# Patient Record
Sex: Male | Born: 1960 | Race: Black or African American | Hispanic: No | Marital: Single | Smoking: Never smoker
Health system: Southern US, Community
[De-identification: ages and names within clinical notes are randomized; demographics above are authoritative.]

## PROBLEM LIST (undated history)

## (undated) DIAGNOSIS — I10 Essential (primary) hypertension: Secondary | ICD-10-CM

## (undated) DIAGNOSIS — G629 Polyneuropathy, unspecified: Secondary | ICD-10-CM

## (undated) DIAGNOSIS — E559 Vitamin D deficiency, unspecified: Secondary | ICD-10-CM

## (undated) HISTORY — PX: OTHER SURGICAL HISTORY: SHX169

## (undated) HISTORY — DX: Essential (primary) hypertension: I10

## (undated) HISTORY — PX: ANKLE FRACTURE SURGERY: SHX122

## (undated) HISTORY — PX: SHOULDER ARTHROSCOPY: SHX128

## (undated) HISTORY — DX: Vitamin D deficiency, unspecified: E55.9

## (undated) HISTORY — PX: APPENDECTOMY: SHX54

## (undated) HISTORY — DX: Polyneuropathy, unspecified: G62.9

---

## 2005-08-14 ENCOUNTER — Emergency Department (HOSPITAL_COMMUNITY): Admission: EM | Admit: 2005-08-14 | Discharge: 2005-08-15 | Payer: Self-pay | Admitting: Emergency Medicine

## 2006-01-09 ENCOUNTER — Emergency Department (HOSPITAL_COMMUNITY): Admission: EM | Admit: 2006-01-09 | Discharge: 2006-01-09 | Payer: Self-pay | Admitting: Emergency Medicine

## 2006-01-18 ENCOUNTER — Ambulatory Visit: Payer: Self-pay | Admitting: Cardiology

## 2007-10-31 ENCOUNTER — Emergency Department (HOSPITAL_COMMUNITY): Admission: EM | Admit: 2007-10-31 | Discharge: 2007-10-31 | Payer: Self-pay | Admitting: Emergency Medicine

## 2009-07-18 ENCOUNTER — Encounter: Admission: RE | Admit: 2009-07-18 | Discharge: 2009-07-18 | Payer: Self-pay | Admitting: Orthopedic Surgery

## 2011-04-30 LAB — CBC
MCV: 92
Platelets: 251
RDW: 12.5
WBC: 9.3

## 2011-04-30 LAB — DIFFERENTIAL
Basophils Absolute: 0
Basophils Relative: 0
Eosinophils Absolute: 0.1
Lymphocytes Relative: 25
Lymphs Abs: 2.3
Monocytes Absolute: 0.6
Monocytes Relative: 6

## 2011-04-30 LAB — POCT I-STAT, CHEM 8
BUN: 11
Chloride: 100
Creatinine, Ser: 1.5
Glucose, Bld: 83
HCT: 46
Hemoglobin: 15.6
TCO2: 30

## 2011-04-30 LAB — POCT CARDIAC MARKERS: Myoglobin, poc: 65

## 2015-01-05 ENCOUNTER — Other Ambulatory Visit: Payer: Self-pay | Admitting: Internal Medicine

## 2015-01-05 DIAGNOSIS — M542 Cervicalgia: Secondary | ICD-10-CM

## 2015-04-20 ENCOUNTER — Ambulatory Visit: Payer: Self-pay | Admitting: Cardiovascular Disease

## 2018-06-13 ENCOUNTER — Encounter: Payer: Self-pay | Admitting: Neurology

## 2018-06-13 ENCOUNTER — Ambulatory Visit: Payer: Self-pay | Admitting: Neurology

## 2018-06-13 ENCOUNTER — Other Ambulatory Visit: Payer: Self-pay

## 2018-06-13 VITALS — BP 137/85 | HR 80 | Resp 16 | Ht 71.0 in | Wt 265.0 lb

## 2018-06-13 DIAGNOSIS — M79671 Pain in right foot: Secondary | ICD-10-CM

## 2018-06-13 DIAGNOSIS — M79672 Pain in left foot: Secondary | ICD-10-CM

## 2018-06-13 DIAGNOSIS — R202 Paresthesia of skin: Secondary | ICD-10-CM

## 2018-06-13 NOTE — Progress Notes (Signed)
Reason for visit: Foot paresthesias  Referring physician: Dr. [name] is a 57 y.o. male  History of present illness:  Craig Callahan is a 57 year old right-handed black male with a history of obesity.  The patient comes over today for a several month history of discomfort in the feet.  The patient had traveled to [place] in March 2019, he noted problems shortly after he returned.  The patient indicates that he has no discomfort at night when he sleeps, when he wakes up in the morning and gets out of bed and first puts pressure on the feet, he has severe pain that is most severe around the heels.  The patient has a spongy feeling in the bottom of the feet and may have some tingling towards the ball of the foot and involving the toes.  The patient indicates that he has tried to increase his exercise and start walking more but this has worsened his pain.  The patient indicates that the pain is fairly symmetric from one side to the next.  He denies any back pain or pain down the legs, he denies any significant issues with numbness in the hands but he has a history of carpal tunnel syndrome in the past.  He denies neck pain.  He denies any balance issues or falls.  He has not had any significant issues controlling the bowels or the bladder.  He is sent to this office for an evaluation.   Past Medical History:  Diagnosis Date  . Hypertension   . Neuropathy     Past Surgical History:  Procedure Laterality Date  . ANKLE FRACTURE SURGERY Right   . APPENDECTOMY    . dermoid tumor Left   . SHOULDER ARTHROSCOPY Left     Family History  Problem Relation Age of Onset  . Heart disease Mother   . Hypertension Mother   . Lung cancer Father   . Lupus Sister   . Kidney failure Brother   . Diabetes Brother   . Healthy Sister   . Hypertension Brother   . Healthy Brother     Social history:  reports that he has never smoked. He has never used smokeless tobacco. He reports that he drinks  alcohol. He reports that he does not use drugs.  Medications:  Prior to Admission medications   Medication Sig Start Date End Date Taking? Authorizing Provider  amLODipine (NORVASC) 5 MG tablet Take 5 mg by mouth daily.   Yes [provider]  aspirin 81 MG chewable tablet Chew by mouth.   Yes [provider]  lisinopril-hydrochlorothiazide (PRINZIDE,ZESTORETIC) 20-12.5 MG tablet Take 1 tablet by mouth daily.   Yes [provider]      Allergies  Allergen Reactions  . Hydrocodone Itching    ROS:  Out of a complete 14 system review of symptoms, the patient complains only of the following symptoms, and all other reviewed systems are negative.  Foot pain  Blood pressure 137/85, pulse 80, resp. rate 16, height 5\' 11"  (1.803 m), weight 265 lb (120.2 kg).  Physical Exam  General: The patient is alert and cooperative at the time of the examination.  The patient is markedly obese.  Eyes: Pupils are equal, round, and reactive to light. Discs are flat bilaterally.  Neck: The neck is supple, no carotid bruits are noted.  Respiratory: The respiratory examination is clear.  Cardiovascular: The cardiovascular examination reveals a regular rate and rhythm, no obvious murmurs or rubs are noted.  Skin: Extremities are without significant edema.  Neurologic Exam  Mental status: The patient is alert and oriented x 3 at the time of the examination. The patient has apparent normal recent and remote memory, with an apparently normal attention span and concentration ability.  Cranial nerves: Facial symmetry is present. There is good sensation of the face to pinprick and soft touch bilaterally. The strength of the facial muscles and the muscles to head turning and shoulder shrug are normal bilaterally. Speech is well enunciated, no aphasia or dysarthria is noted. Extraocular movements are full. Visual fields are full. The tongue is midline, and the patient has symmetric  elevation of the soft palate. No obvious hearing deficits are noted.  Motor: The motor testing reveals 5 over 5 strength of all 4 extremities. Good symmetric motor tone is noted throughout.  Sensory: Sensory testing is intact to pinprick, soft touch, vibration sensation, and position sense on all 4 extremities.  No definite stocking pattern pinprick sensory deficit was noted in the legs.  No evidence of extinction is noted.  Coordination: Cerebellar testing reveals good finger-nose-finger and heel-to-shin bilaterally.  Gait and station: Gait is normal. Tandem gait is normal. Romberg is negative. No drift is seen.  Reflexes: Deep tendon reflexes are symmetric and normal bilaterally. Toes are downgoing bilaterally.   Assessment/Plan:  1.  Bilateral foot pain, probable plantar fasciitis  2.  Foot paresthesias, rule out peripheral neuropathy  The patient will be set up for nerve conduction studies of both legs and EMG on one leg.  If a peripheral neuropathy is present, further blood work testing will be done.  The patient will be referred to a podiatry physician for the foot pain.  Marlan Palau MD 06/13/2018 10:25 AM  [facility] [address] [place], [place] [address]  Phone [phone] Fax [phone]

## 2018-07-21 ENCOUNTER — Ambulatory Visit (INDEPENDENT_AMBULATORY_CARE_PROVIDER_SITE_OTHER): Payer: Self-pay | Admitting: Neurology

## 2018-07-21 ENCOUNTER — Encounter: Payer: Self-pay | Admitting: Neurology

## 2018-07-21 DIAGNOSIS — R202 Paresthesia of skin: Secondary | ICD-10-CM

## 2018-07-21 DIAGNOSIS — M79671 Pain in right foot: Secondary | ICD-10-CM

## 2018-07-21 DIAGNOSIS — M79672 Pain in left foot: Secondary | ICD-10-CM

## 2018-07-21 NOTE — Progress Notes (Signed)
Please refer to EMG and nerve conduction procedure note.  

## 2018-07-21 NOTE — Progress Notes (Addendum)
The patient comes in today for EMG nerve conduction study evaluation.  The studies were completely normal, no evidence of a peripheral neuropathy or evidence of a lumbosacral radiculopathy was noted.  The patient likely has plantar fasciitis, he will be referred to podiatry.   MNC    Nerve / Sites Muscle Latency Ref. Amplitude Ref. Rel Amp Segments Distance Velocity Ref. Area    ms ms mV mV %  cm m/s m/s mVms  R Peroneal - EDB     Ankle EDB 5.7 ?6.5 8.2 ?2.0 100 Ankle - EDB 9   23.7     Fib head EDB 12.7  7.5  92.5 Fib head - Ankle 36 52 ?44 25.0     Pop fossa EDB 14.4  9.0  120 Pop fossa - Fib head 10 56 ?44 36.0         Pop fossa - Ankle      L Peroneal - EDB     Ankle EDB 5.3 ?6.5 10.8 ?2.0 100 Ankle - EDB 9   31.5     Fib head EDB 12.5  9.8  90.8 Fib head - Ankle 36 50 ?44 32.1     Pop fossa EDB 14.4  8.2  83.7 Pop fossa - Fib head 10 53 ?44 25.9         Pop fossa - Ankle      R Tibial - AH     Ankle AH 4.8 ?5.8 12.3 ?4.0 100 Ankle - AH 9   29.9     Pop fossa AH 14.0  10.5  85.8 Pop fossa - Ankle 40 44 ?41 34.9  L Tibial - AH     Ankle AH 5.4 ?5.8 12.8 ?4.0 100 Ankle - AH 9   38.6     Pop fossa AH 14.7  10.9  85.4 Pop fossa - Ankle 39 42 ?41 36.7             SNC    Nerve / Sites Rec. Site Peak Lat Ref.  Amp Ref. Segments Distance Peak Diff Ref.    ms ms V V  cm ms ms  R Sural - Ankle (Calf)     Calf Ankle 3.7 ?4.4 7 ?6 Calf - Ankle 14    L Sural - Ankle (Calf)     Calf Ankle 3.5 ?4.4 6 ?6 Calf - Ankle 14    R Superficial peroneal - Ankle     Lat leg Ankle 4.0 ?4.4 8 ?6 Lat leg - Ankle 14    L Superficial peroneal - Ankle     Lat leg Ankle 3.9 ?4.4 6 ?6 Lat leg - Ankle 14    R Medial plantar, Lateral plantar - Ankle (Medial, lateral sole)     Medial plantar Sole Ankle 3.6 ?3.7 4 ?3 Medial plantar Sole - Ankle 14       Lateral plantar Sole Ankle 3.5 ?3.7 3 ?3 Lateral plantar Sole - Ankle 14          Medial plantar Sole - Lateral plantar Sole  0.1 ?0.0               F   Wave    Nerve F Lat Ref.   ms ms  R Tibial - AH 52.3 ?56.0  L Tibial - AH 52.0 ?56.0

## 2018-07-21 NOTE — Procedures (Signed)
**Note Craig-Identified via Obfuscation**      HISTORY:  Craig Callahan is a 57 year old gentleman with a history of bilateral foot pain at the heel but also with some numbness in the ball of the feet.  The patient is being evaluated for a possible peripheral neuropathy.  He does not have any history of back pain.  NERVE CONDUCTION STUDIES:  Nerve conduction studies were performed on both lower extremities. The distal motor latencies and motor amplitudes for the peroneal and posterior tibial nerves were within normal limits. The nerve conduction velocities for these nerves were also normal. The sensory latencies for the peroneal and sural nerves were within normal limits.  The medial and lateral plantar sensory latencies on the right were normal.  The F wave latencies for the posterior tibial nerves were within normal limits.   EMG STUDIES:  EMG study was performed on the right leg lower extremity:  The tibialis anterior muscle reveals 2 to 4K motor units with full recruitment. No fibrillations or positive waves were seen. The peroneus tertius muscle reveals 2 to 4K motor units with full recruitment. No fibrillations or positive waves were seen. The medial gastrocnemius muscle reveals 1 to 3K motor units with full recruitment. No fibrillations or positive waves were seen. The vastus lateralis muscle reveals 2 to 4K motor units with full recruitment. No fibrillations or positive waves were seen. The iliopsoas muscle reveals 2 to 4K motor units with full recruitment. No fibrillations or positive waves were seen. The biceps femoris muscle (long head) reveals 2 to 4K motor units with full recruitment. No fibrillations or positive waves were seen. The lumbosacral paraspinal muscles were tested at 3 levels, and revealed no abnormalities of insertional activity at all 3 levels tested. There was good relaxation.   IMPRESSION:  Nerve conduction studies done on both lower extremities were unremarkable, no evidence of a peripheral neuropathy  was seen.  There is no evidence of tarsal tunnel syndrome by nerve conduction studies.  EMG evaluation of the right lower extremity was unremarkable, no evidence of an overlying lumbosacral radiculopathy was noted.  Marlan Palau. Keith Khloei Spiker MD 07/21/2018 9:27 AM  [facility] [address] [place], [place] [address]  Phone [phone] Fax [phone]

## 2018-08-08 ENCOUNTER — Ambulatory Visit: Payer: Self-pay | Admitting: Podiatry

## 2018-10-08 ENCOUNTER — Ambulatory Visit: Payer: BLUE CROSS/BLUE SHIELD | Admitting: Podiatry

## 2018-10-08 ENCOUNTER — Other Ambulatory Visit: Payer: Self-pay | Admitting: Podiatry

## 2018-10-08 ENCOUNTER — Encounter: Payer: Self-pay | Admitting: Podiatry

## 2018-10-08 ENCOUNTER — Ambulatory Visit (INDEPENDENT_AMBULATORY_CARE_PROVIDER_SITE_OTHER): Payer: BLUE CROSS/BLUE SHIELD

## 2018-10-08 VITALS — BP 101/65

## 2018-10-08 DIAGNOSIS — M79671 Pain in right foot: Secondary | ICD-10-CM | POA: Diagnosis not present

## 2018-10-08 DIAGNOSIS — M79672 Pain in left foot: Secondary | ICD-10-CM | POA: Diagnosis not present

## 2018-10-08 DIAGNOSIS — M722 Plantar fascial fibromatosis: Secondary | ICD-10-CM | POA: Diagnosis not present

## 2018-10-08 MED ORDER — DICLOFENAC SODIUM 75 MG PO TBEC: 75.0000 mg | DELAYED_RELEASE_TABLET | Freq: Two times a day (BID) | ORAL | 2 refills | Status: DC

## 2018-10-08 MED ORDER — TRIAMCINOLONE ACETONIDE 10 MG/ML IJ SUSP
10.0000 mg | Freq: Once | INTRAMUSCULAR | Status: AC
  Administered 2018-10-08: 10 mg

## 2018-10-08 NOTE — Patient Instructions (Signed)

## 2018-10-08 NOTE — Progress Notes (Signed)
**Note Craig-Identified via Obfuscation** Subjective:   Patient ID: Craig Callahan, male   DOB: 58 y.o.   MRN: [id number]   HPI Patient presents with severe pain in the right heel of 1 year duration with mild pain in the left.  States is gotten worse over that time and it feels like he is walking on pins-and-needles and patient has had neurological studies which were normal.  Patient does not smoke likes to be active   Review of Systems  All other systems reviewed and are negative.       Objective:  Physical Exam Vitals signs and nursing note reviewed.  Constitutional:      Appearance: He is well-developed.  Pulmonary:     Effort: Pulmonary effort is normal.  Musculoskeletal: Normal range of motion.  Skin:    General: Skin is warm.  Neurological:     Mental Status: He is alert.     Neurovascular status intact muscle strength is adequate range of motion within normal limits with patient found to have exquisite discomfort plantar aspect right heel at the insertional point tendon into the calcaneus and mild pain on the plantar left heel.  Patient has mild high arch foot structure and has good digital perfusion and is well oriented x3     Assessment:  Acute plantar fasciitis right over left 1 year duration with no indications of neurological disease     Plan:  H&P conditions reviewed and today I did sterile prep and injected the plantar fascia right 3 mg Kenalog 5 mg Xylocaine and applied fascial brace with instructions on usage.  Patient will be seen back to recheck and may require orthotics or other treatment  X-rays indicate small spur formation high arch foot structure with no indication of stress fracture arthritis

## 2018-10-22 ENCOUNTER — Emergency Department (HOSPITAL_COMMUNITY): Payer: BLUE CROSS/BLUE SHIELD

## 2018-10-22 ENCOUNTER — Encounter (HOSPITAL_COMMUNITY): Payer: Self-pay

## 2018-10-22 ENCOUNTER — Emergency Department (HOSPITAL_COMMUNITY)
Admission: EM | Admit: 2018-10-22 | Discharge: 2018-10-22 | Disposition: A | Discharge status: Home / Self Care | Payer: BLUE CROSS/BLUE SHIELD | Attending: Emergency Medicine | Admitting: Emergency Medicine

## 2018-10-22 ENCOUNTER — Ambulatory Visit: Payer: BLUE CROSS/BLUE SHIELD | Admitting: Podiatry

## 2018-10-22 DIAGNOSIS — R1030 Lower abdominal pain, unspecified: Secondary | ICD-10-CM

## 2018-10-22 DIAGNOSIS — Z79899 Other long term (current) drug therapy: Secondary | ICD-10-CM | POA: Insufficient documentation

## 2018-10-22 DIAGNOSIS — R197 Diarrhea, unspecified: Secondary | ICD-10-CM | POA: Insufficient documentation

## 2018-10-22 DIAGNOSIS — I10 Essential (primary) hypertension: Secondary | ICD-10-CM | POA: Diagnosis not present

## 2018-10-22 DIAGNOSIS — Z7982 Long term (current) use of aspirin: Secondary | ICD-10-CM | POA: Diagnosis not present

## 2018-10-22 LAB — BASIC METABOLIC PANEL
Anion gap: 9 (ref 5–15)
BUN: 16 mg/dL (ref 6–20)
CALCIUM: 8.9 mg/dL (ref 8.9–10.3)
CO2: 25 mmol/L (ref 22–32)
CREATININE: 1.22 mg/dL (ref 0.61–1.24)
Chloride: 102 mmol/L (ref 98–111)
GFR calc non Af Amer: >60 mL/min (ref >60)
Glucose, Bld: 134 mg/dL — ABNORMAL HIGH (ref 70–99)
Potassium: 4 mmol/L (ref 3.5–5.1)
SODIUM: 136 mmol/L (ref 135–145)

## 2018-10-22 LAB — CBC
HCT: 41.1 % (ref 39.0–52.0)
Hemoglobin: 13.5 g/dL (ref 13.0–17.0)
MCH: 30.7 pg (ref 26.0–34.0)
MCHC: 32.8 g/dL (ref 30.0–36.0)
MCV: 93.4 fL (ref 80.0–100.0)
Platelets: 250 10*3/uL (ref 150–400)
RBC: 4.4 MIL/uL (ref 4.22–5.81)
RDW: 13.2 % (ref 11.5–15.5)
WBC: 12.2 10*3/uL — AB (ref 4.0–10.5)
nRBC: 0 % (ref 0.0–0.2)

## 2018-10-22 MED ORDER — KETOROLAC TROMETHAMINE 30 MG/ML IJ SOLN
15.0000 mg | Freq: Once | INTRAMUSCULAR | Status: AC
  Administered 2018-10-22: 15 mg via INTRAVENOUS
  Filled 2018-10-22: qty 1

## 2018-10-22 MED ORDER — METOCLOPRAMIDE HCL 5 MG/ML IJ SOLN
10.0000 mg | INTRAMUSCULAR | Status: AC
  Administered 2018-10-22: 10 mg via INTRAVENOUS
  Filled 2018-10-22: qty 2

## 2018-10-22 MED ORDER — IOHEXOL 300 MG/ML  SOLN
125.0000 mL | Freq: Once | INTRAMUSCULAR | Status: AC | PRN
  Administered 2018-10-22: 125 mL via INTRAVENOUS

## 2018-10-22 MED ORDER — FENTANYL CITRATE (PF) 100 MCG/2ML IJ SOLN
50.0000 ug | Freq: Once | INTRAMUSCULAR | Status: AC
  Administered 2018-10-22: 50 ug via INTRAVENOUS
  Filled 2018-10-22: qty 2

## 2018-10-22 MED ORDER — SODIUM CHLORIDE 0.9 % IV BOLUS
1000.0000 mL | Freq: Once | INTRAVENOUS | Status: AC
  Administered 2018-10-22: 1000 mL via INTRAVENOUS

## 2018-10-22 MED ORDER — ONDANSETRON HCL 4 MG/2ML IJ SOLN
4.0000 mg | Freq: Once | INTRAMUSCULAR | Status: AC
  Administered 2018-10-22: 4 mg via INTRAVENOUS
  Filled 2018-10-22: qty 2

## 2018-10-22 MED ORDER — ONDANSETRON 4 MG PO TBDP: 4.0000 mg | ORAL_TABLET | Freq: Three times a day (TID) | ORAL | 0 refills | Status: DC | PRN

## 2018-10-22 NOTE — ED Notes (Signed)
Patient transported to CT 

## 2018-10-22 NOTE — ED Provider Notes (Signed)
**Note Craig-Identified via Obfuscation** [facility] EM[facility]GENCY DEPARTMENT Provider Note   CSN: [id number] Arrival date & time: 10/22/18  0149    History   Chief Complaint Chief Complaint  Patient presents with   Abdominal Pain    HPI Craig HollingsheadJohn Callahan is a 58 y.o. male.    58 year old male with a history of hypertension presents to the emergency department for evaluation of nausea and diarrhea.  Reports that his symptoms began this evening around 2100.  States that he had some ham later in the afternoon and is concerned that it may have been spoiled.  Reports 7-8 episodes of nonbloody, watery diarrhea.  He has been experiencing waves of nausea as well as some lower abdominal discomfort.  Abdominal pain has been constant and will radiate towards his epigastrium.  It waxes and wanes in severity without modifying factors.  He has not taken any medications for his symptoms tonight.  Abdominal surgical history significant for appendectomy.  No fevers, vomiting, urinary symptoms, sick contacts.  The history is provided by the patient. No language interpreter was used.  Abdominal Pain    Past Medical History:  Diagnosis Date   Hypertension    Neuropathy     There are no active problems to display for this patient.   Past Surgical History:  Procedure Laterality Date   ANKLE FRACTURE SURGERY Right    APPENDECTOMY     dermoid tumor Left    SHOULDER ARTHROSCOPY Left         Home Medications    Prior to Admission medications   Medication Sig Start Date End Date Taking? Authorizing Provider  amLODipine (NORVASC) 5 MG tablet Take 5 mg by mouth daily.    [provider]  aspirin 81 MG chewable tablet Chew by mouth.    [provider]  diclofenac (VOLTAREN) 75 MG EC tablet Take 1 tablet (75 mg total) by mouth 2 (two) times daily. 10/08/18   Lenn Sinkegal, Norman S, DPM  lisinopril-hydrochlorothiazide (PRINZIDE,ZESTORETIC) 20-12.5 MG tablet Take 1 tablet by mouth daily.    [provider]  ondansetron (ZOFRAN ODT) 4 MG disintegrating tablet Take 1 tablet (4 mg total) by mouth every 8 (eight) hours as needed for nausea or vomiting. 10/22/18   Antony MaduraHumes, Moksh Loomer, PA-C    Family History Family History  Problem Relation Age of Onset   Heart disease Mother    Hypertension Mother    Lung cancer Father    Lupus Sister    Kidney failure Brother    Diabetes Brother    Healthy Sister    Hypertension Brother    Healthy Brother     Social History Social History   Tobacco Use   Smoking status: Never Smoker   Smokeless tobacco: Never Used  Substance Use Topics   Alcohol use: Yes    Comment: occ. 1 glass once a month with dinner   Drug use: Never     Allergies   Hydrocodone   Review of Systems Review of Systems  Gastrointestinal: Positive for abdominal pain.  Ten systems reviewed and are negative for acute change, except as noted in the HPI.    Physical Exam Updated Vital Signs BP (!) 102/58    Pulse 81    Temp 98 F (36.7 C) (Oral)    Resp 18    SpO2 97%   Physical Exam Vitals signs and nursing note reviewed.  Constitutional:      General: He is not in acute distress.    Appearance: He is well-developed.  He is not diaphoretic.     Comments: Nontoxic-appearing and in no distress  HENT:     Head: Normocephalic and atraumatic.  Eyes:     General: No scleral icterus.    Conjunctiva/sclera: Conjunctivae normal.  Neck:     Musculoskeletal: Normal range of motion.  Cardiovascular:     Rate and Rhythm: Normal rate and regular rhythm.     Pulses: Normal pulses.  Pulmonary:     Effort: Pulmonary effort is normal. No respiratory distress.     Breath sounds: No stridor.     Comments: Respirations even and unlabored Abdominal:     Comments: Soft, obese abdomen.  There is mild tenderness in the right mid abdomen and right lower quadrant.  No peritoneal signs or palpable masses.  Musculoskeletal: Normal range of motion.  Skin:    General: Skin is warm  and dry.     Coloration: Skin is not pale.     Findings: No erythema or rash.  Neurological:     Mental Status: He is alert and oriented to person, place, and time.  Psychiatric:        Behavior: Behavior normal.      ED Treatments / Results  Labs (all labs ordered are listed, but only abnormal results are displayed) Labs Reviewed  CBC - Abnormal; Notable for the following components:      Result Value   WBC 12.2 (*)    All other components within normal limits  BASIC METABOLIC PANEL - Abnormal; Notable for the following components:   Glucose, Bld 134 (*)    All other components within normal limits    EKG None  Radiology Ct Abdomen Pelvis W Contrast  Result Date: 10/22/2018 CLINICAL DATA:  Nausea and diarrhea EXAM: CT ABDOMEN AND PELVIS WITH CONTRAST TECHNIQUE: Multidetector CT imaging of the abdomen and pelvis was performed using the standard protocol following bolus administration of intravenous contrast. CONTRAST:  OMNIPAQUE IOHEXOL 300 MG/ML  SOLN COMPARISON:  None. FINDINGS: LOWER CHEST: There is no basilar pleural or apical pericardial effusion. HEPATOBILIARY: The hepatic contours and density are normal. There is no intra- or extrahepatic biliary dilatation. The gallbladder is normal. PANCREAS: The pancreatic parenchymal contours are normal and there is no ductal dilatation. There is no peripancreatic fluid collection. SPLEEN: Normal. ADRENALS/URINARY TRACT: --Adrenal glands: Normal. --Right kidney/ureter: No hydronephrosis, nephroureterolithiasis, perinephric stranding or solid renal mass. --Left kidney/ureter: No hydronephrosis, nephroureterolithiasis, perinephric stranding or solid renal mass. --Urinary bladder: Normal for degree of distention STOMACH/BOWEL: --Stomach/Duodenum: There is no hiatal hernia or other gastric abnormality. The duodenal course and caliber are normal. --Small bowel: No dilatation or inflammation. --Colon: No focal abnormality. --Appendix: Not  visualized. No right lower quadrant inflammation or free fluid. VASCULAR/LYMPHATIC: Normal course and caliber of the major abdominal vessels. No abdominal or pelvic lymphadenopathy. REPRODUCTIVE: Normal prostate size with symmetric seminal vesicles. MUSCULOSKELETAL. No bony spinal canal stenosis or focal osseous abnormality. OTHER: None. IMPRESSION: No acute abnormality of the abdomen or pelvis. Electronically Signed   By: Deatra Robinson M.D.   On: 10/22/2018 04:22    Procedures Procedures (including critical care time)  Medications Ordered in ED Medications  sodium chloride 0.9 % bolus 1,000 mL (1,000 mLs Intravenous New Bag/Given 10/22/18 0229)  metoCLOPramide (REGLAN) injection 10 mg (10 mg Intravenous Given 10/22/18 0231)  ketorolac (TORADOL) 30 MG/ML injection 15 mg (15 mg Intravenous Given 10/22/18 0230)  ondansetron (ZOFRAN) injection 4 mg (4 mg Intravenous Given 10/22/18 0421)  fentaNYL (SUBLIMAZE) injection 50 mcg (  50 mcg Intravenous Given 10/22/18 0421)  sodium chloride 0.9 % bolus 1,000 mL (0 mLs Intravenous Stopped 10/22/18 0422)  iohexol (OMNIPAQUE) 300 MG/ML solution 125 mL (125 mLs Intravenous Contrast Given 10/22/18 0406)  ketorolac (TORADOL) 30 MG/ML injection 15 mg (15 mg Intravenous Given 10/22/18 0440)    3:50 AM Persistent pain with TTP across the lower abdomen. No guarding. VSS. Patient does have a nonspecific leukocytosis. Will obtain CT for further evaluation. Question possible diverticulitis. Patient to receive additional IVF with fentanyl and Zofran for persistent nausea and pain.  4:42 AM Conveyed negative imaging results to the patient who verbalizes understanding.  Will fluid challenge.  If able to tolerate PO, will plan for discharge with supportive treatment.   Initial Impression / Assessment and Plan / ED Course  I have reviewed the triage vital signs and the nursing notes.  Pertinent labs & imaging results that were available during my care of the patient were  reviewed by me and considered in my medical decision making (see chart for details).        Patient presenting with diarrhea and nausea with onset after eating ham this afternoon. Associated lower abdominal discomfort noted. Mild leukocytosis likely secondary to mild diarrheal illness.  Labs otherwise reassuring.  Patient did undergo CT scan which is negative for acute abdominal or pelvic process.  He has had symptomatic improvement with IV fluids, pain medication, nausea medicine.  Now tolerating oral fluids without difficulty.  Supportive therapy indicated with return if symptoms worsen.  Return precautions discussed and provided.  Patient discharged in stable condition with no unaddressed concerns.   Final Clinical Impressions(s) / ED Diagnoses   Final diagnoses:  Lower abdominal pain  Diarrhea, unspecified type    ED Discharge Orders         Ordered    ondansetron (ZOFRAN ODT) 4 MG disintegrating tablet  Every 8 hours PRN     10/22/18 0451           Antony Madura, PA-C 10/22/18 0459    Shaune Pollack, MD 10/23/18 [phone]

## 2018-10-22 NOTE — ED Triage Notes (Signed)
Pt states that he ate some ham today and since had some nausea and diarrhea.

## 2018-10-22 NOTE — Discharge Instructions (Signed)
Avoid fried foods, fatty foods, greasy foods, and milk products until symptoms resolve. Drink plenty of clear liquids. We recommend the use of Zofran as prescribed for nausea.  You may take Tylenol or ibuprofen as needed for abdominal pain.  Follow-up with your primary care doctor to ensure resolution of symptoms.

## 2018-11-05 ENCOUNTER — Ambulatory Visit: Payer: BLUE CROSS/BLUE SHIELD | Admitting: Podiatry

## 2019-02-04 ENCOUNTER — Other Ambulatory Visit: Payer: Self-pay

## 2019-02-04 DIAGNOSIS — Z20822 Contact with and (suspected) exposure to covid-19: Secondary | ICD-10-CM

## 2019-02-11 LAB — NOVEL CORONAVIRUS, NAA: SARS-CoV-2, NAA: NOT DETECTED

## 2019-04-10 ENCOUNTER — Ambulatory Visit
Admission: RE | Admit: 2019-04-10 | Discharge: 2019-04-10 | Disposition: A | Discharge status: Home / Self Care | Payer: BC Managed Care – PPO | Source: Ambulatory Visit | Attending: Nurse Practitioner | Admitting: Nurse Practitioner

## 2019-04-10 ENCOUNTER — Other Ambulatory Visit: Payer: Self-pay

## 2019-04-10 ENCOUNTER — Other Ambulatory Visit: Payer: BLUE CROSS/BLUE SHIELD

## 2019-04-10 ENCOUNTER — Other Ambulatory Visit: Payer: Self-pay | Admitting: Nurse Practitioner

## 2019-04-10 DIAGNOSIS — H5340 Unspecified visual field defects: Secondary | ICD-10-CM

## 2019-04-10 MED ORDER — IOPAMIDOL (ISOVUE-370) INJECTION 76%
75.0000 mL | Freq: Once | INTRAVENOUS | Status: AC | PRN
  Administered 2019-04-10: 75 mL via INTRAVENOUS

## 2019-12-09 DIAGNOSIS — I10 Essential (primary) hypertension: Secondary | ICD-10-CM | POA: Diagnosis not present

## 2019-12-09 DIAGNOSIS — I2510 Atherosclerotic heart disease of native coronary artery without angina pectoris: Secondary | ICD-10-CM | POA: Diagnosis not present

## 2019-12-09 DIAGNOSIS — G4733 Obstructive sleep apnea (adult) (pediatric): Secondary | ICD-10-CM | POA: Diagnosis not present

## 2019-12-09 DIAGNOSIS — E559 Vitamin D deficiency, unspecified: Secondary | ICD-10-CM | POA: Diagnosis not present

## 2020-01-04 DIAGNOSIS — G4733 Obstructive sleep apnea (adult) (pediatric): Secondary | ICD-10-CM | POA: Diagnosis not present

## 2020-01-04 DIAGNOSIS — G4736 Sleep related hypoventilation in conditions classified elsewhere: Secondary | ICD-10-CM | POA: Diagnosis not present

## 2020-01-04 DIAGNOSIS — G4761 Periodic limb movement disorder: Secondary | ICD-10-CM | POA: Diagnosis not present

## 2020-01-04 DIAGNOSIS — R0902 Hypoxemia: Secondary | ICD-10-CM | POA: Diagnosis not present

## 2020-01-11 DIAGNOSIS — I10 Essential (primary) hypertension: Secondary | ICD-10-CM | POA: Diagnosis not present

## 2020-01-11 DIAGNOSIS — E669 Obesity, unspecified: Secondary | ICD-10-CM | POA: Diagnosis not present

## 2020-01-11 DIAGNOSIS — G4733 Obstructive sleep apnea (adult) (pediatric): Secondary | ICD-10-CM | POA: Diagnosis not present

## 2020-01-21 DIAGNOSIS — G4733 Obstructive sleep apnea (adult) (pediatric): Secondary | ICD-10-CM | POA: Diagnosis not present

## 2020-02-20 DIAGNOSIS — G4733 Obstructive sleep apnea (adult) (pediatric): Secondary | ICD-10-CM | POA: Diagnosis not present

## 2020-03-02 DIAGNOSIS — S0501XA Injury of conjunctiva and corneal abrasion without foreign body, right eye, initial encounter: Secondary | ICD-10-CM | POA: Diagnosis not present

## 2020-03-22 DIAGNOSIS — G4733 Obstructive sleep apnea (adult) (pediatric): Secondary | ICD-10-CM | POA: Diagnosis not present

## 2020-03-31 DIAGNOSIS — I10 Essential (primary) hypertension: Secondary | ICD-10-CM | POA: Diagnosis not present

## 2020-03-31 DIAGNOSIS — Z1211 Encounter for screening for malignant neoplasm of colon: Secondary | ICD-10-CM | POA: Diagnosis not present

## 2020-04-22 DIAGNOSIS — G4733 Obstructive sleep apnea (adult) (pediatric): Secondary | ICD-10-CM | POA: Diagnosis not present

## 2020-06-15 DIAGNOSIS — R072 Precordial pain: Secondary | ICD-10-CM | POA: Diagnosis not present

## 2020-06-21 ENCOUNTER — Ambulatory Visit: Payer: Self-pay | Admitting: Cardiology

## 2020-06-24 NOTE — Progress Notes (Addendum)
Patient referred by [place] for chest pain  Subjective:   Craig Callahan, male    DOB: Apr 19, 1961, 59 y.o.   MRN: [id number]   Chief Complaint  Patient presents with  . Coronary Artery Disease  . New Patient (Initial Visit)    HPI  59 y.o. 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 He states that his job is very stressful. Few days ago, patient was walking with a colleague at lunch time, from the museum to [facility] and back. This is at least couple miles of walk. On the way back, he developed left sided sharp pain, associated with shortness of breath and lightheadedness. He did not lose consciousness. He sat down and had lunch at [employer]. His pain slowly subsided. He has had similar chest pain on other occasions while at rest, lasting for 30-60 min. On a separate note, he has tingling in his left hand when he crosses his arms.  He endorses diet which comprises of at least 50% outside meals. He has been trying to lose weight with walks and exercise, but has not had much success.  Past Medical History:  Diagnosis Date  . Hypertension   . Neuropathy      Past Surgical History:  Procedure Laterality Date  . ANKLE FRACTURE SURGERY Right   . APPENDECTOMY    . dermoid tumor Left   . SHOULDER ARTHROSCOPY Left      Social History   Tobacco Use  Smoking Status Never Smoker  Smokeless Tobacco Never Used    Social History   Substance and Sexual Activity  Alcohol Use Yes   Comment: occ. 1 glass once a month with dinner     Family History  Problem Relation Age of Onset  . Heart disease Mother   . Hypertension Mother   . Lung cancer Father   . Lupus Sister   . Kidney failure Brother   . Diabetes Brother   . Healthy Sister   . Hypertension Brother   . Healthy Brother      Current Outpatient Medications on File Prior to Visit  Medication Sig  Dispense Refill  . amLODipine (NORVASC) 5 MG tablet Take 5 mg by mouth daily.    Marland Kitchen aspirin 81 MG chewable tablet Chew by mouth.    . diclofenac (VOLTAREN) 75 MG EC tablet Take 1 tablet (75 mg total) by mouth 2 (two) times daily. 50 tablet 2  . lisinopril-hydrochlorothiazide (PRINZIDE,ZESTORETIC) 20-12.5 MG tablet Take 1 tablet by mouth daily.    . ondansetron (ZOFRAN ODT) 4 MG disintegrating tablet Take 1 tablet (4 mg total) by mouth every 8 (eight) hours as needed for nausea or vomiting. 10 tablet 0   No current facility-administered medications on file prior to visit.    Cardiovascular and other pertinent studies:  EKG 06/27/2020: Sinus rhythm 72 bpm Left anterior fascicular block   Echocardiogram 09/16/2019 [facility]): Mild LVH. EF 55-60%.   XR- Chest 09/09/2019: Heart size is normal. No infiltrates, masses  CTA head 04/2019: Negative CT head Negative CTA head. No intracranial stenosis or occlusion. No evidence of vasculitis.  GXT 2016: 10 mets. No ischemia  Recent labs: 10/2018: Glucose 134, BUN/Cr 16/1.22. EGFR >60. Na/K 136/4.0.  H/H 13/41. MCV 93. Platelets 250   Review of Systems  Cardiovascular: Positive for chest pain. Negative for dyspnea on exertion, leg swelling, palpitations and syncope.  Neurological:  Positive for paresthesias.         Vitals:   06/27/20 1256  BP: 120/76  Pulse: 75  Resp: 16  SpO2: 93%     Body mass index is 38.49 kg/m. Filed Weights   06/27/20 1256  Weight: 276 lb (125.2 kg)     Objective:   Physical Exam Vitals and nursing note reviewed.  Constitutional:      General: He is not in acute distress. Neck:     Vascular: No JVD.  Cardiovascular:     Rate and Rhythm: Normal rate and regular rhythm.     Pulses: Normal pulses.     Heart sounds: Normal heart sounds. No murmur heard.   Pulmonary:     Effort: Pulmonary effort is normal.     Breath sounds: Normal breath sounds. No wheezing or rales.    Musculoskeletal:     Right lower leg: No edema.     Left lower leg: No edema.         Assessment & Recommendations:   59 y.o. Caucasian male with hypertension, OSA on CPAP, chest pain  Chest pain: Has features of both typical angina and atypical chest pain. Will obtain exercise nuclear stress test and CT cardiac scoring Check lipid panel  Hypertension: Controlled. Continue current antihypertensive therapy.   Discussed calorie negative diet and portion control.   Further recommendations after above testing.   Thank you for referring the patient to [place]. Please feel free to contact with any questions.   Nigel Mormon, MD Pager: [phone] Office: [phone]

## 2020-06-27 ENCOUNTER — Other Ambulatory Visit: Payer: Self-pay

## 2020-06-27 ENCOUNTER — Encounter: Payer: Self-pay | Admitting: Cardiology

## 2020-06-27 ENCOUNTER — Ambulatory Visit: Payer: BC Managed Care – PPO | Admitting: Cardiology

## 2020-06-27 VITALS — BP 120/76 | HR 75 | Resp 16 | Ht 71.0 in | Wt 276.0 lb

## 2020-06-27 DIAGNOSIS — R0789 Other chest pain: Secondary | ICD-10-CM

## 2020-06-27 DIAGNOSIS — Z13220 Encounter for screening for lipoid disorders: Secondary | ICD-10-CM | POA: Insufficient documentation

## 2020-06-27 DIAGNOSIS — R072 Precordial pain: Secondary | ICD-10-CM | POA: Insufficient documentation

## 2020-06-27 DIAGNOSIS — I10 Essential (primary) hypertension: Secondary | ICD-10-CM | POA: Diagnosis not present

## 2020-07-04 ENCOUNTER — Ambulatory Visit: Payer: BC Managed Care – PPO

## 2020-07-04 ENCOUNTER — Other Ambulatory Visit: Payer: Self-pay

## 2020-07-04 DIAGNOSIS — R0789 Other chest pain: Secondary | ICD-10-CM

## 2020-07-14 ENCOUNTER — Ambulatory Visit: Payer: BC Managed Care – PPO

## 2020-10-29 IMAGING — CT CT ABDOMEN AND PELVIS WITH CONTRAST
2 of 5 series · 16 of 46 positions shown, 18 images · IV contrast (omnipaque)
Comparison: None.

CLINICAL DATA: Nausea and diarrhea

EXAM:
CT ABDOMEN AND PELVIS WITH CONTRAST
TECHNIQUE: Multidetector CT imaging of the abdomen and pelvis was performed
using the standard protocol following bolus administration of
intravenous contrast.
CONTRAST:  125mL OMNIPAQUE IOHEXOL 300 MG/ML  SOLN

[Series 3: a/p w/ 5mm · axial · 0.98mm/px · z∈[+682,+1157]mm · 13 of 105 slices shown, 15 images]
[im 5/105  soft-tissue]
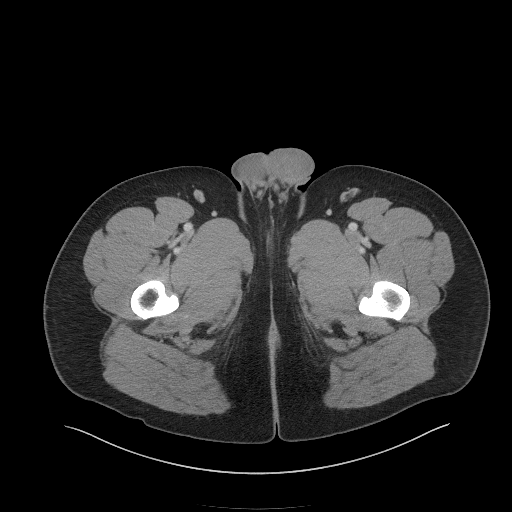
[im 5/105  bone]
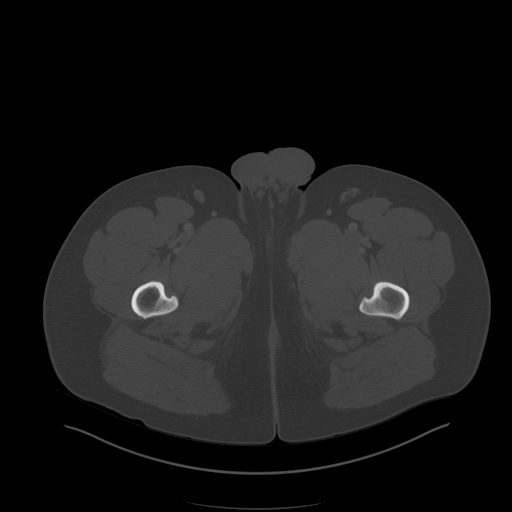
[im 15/105  soft-tissue]
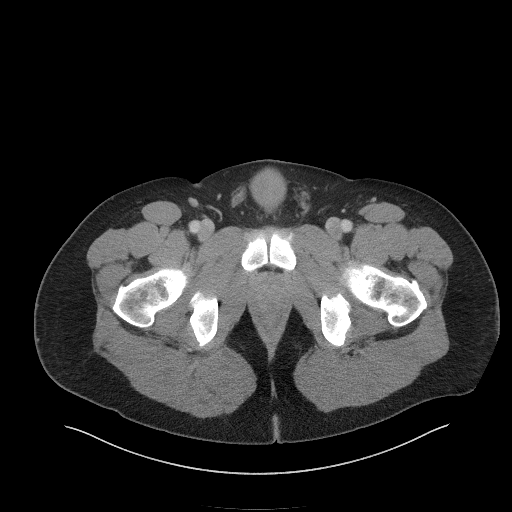
[im 20/105  soft-tissue]
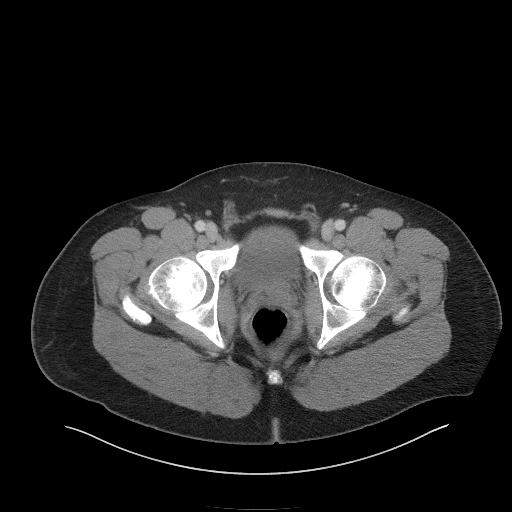
[im 30/105  soft-tissue]
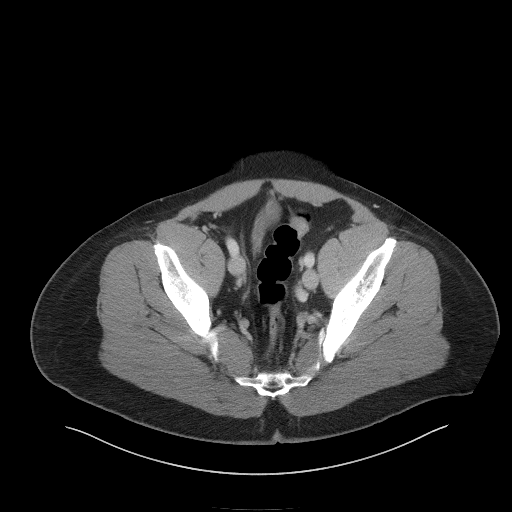
[im 35/105  soft-tissue]
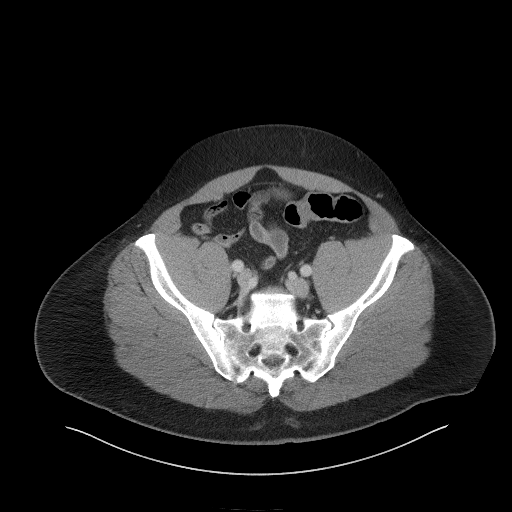
[im 45/105  soft-tissue]
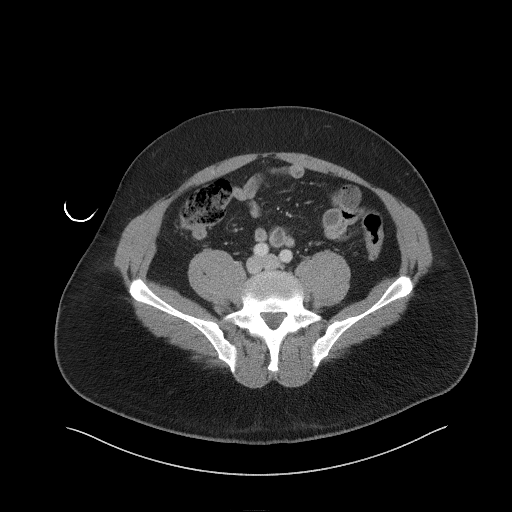
[im 55/105  soft-tissue]
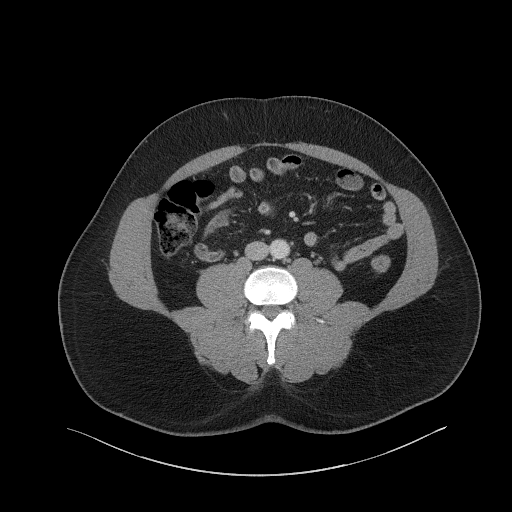
[im 60/105  soft-tissue]
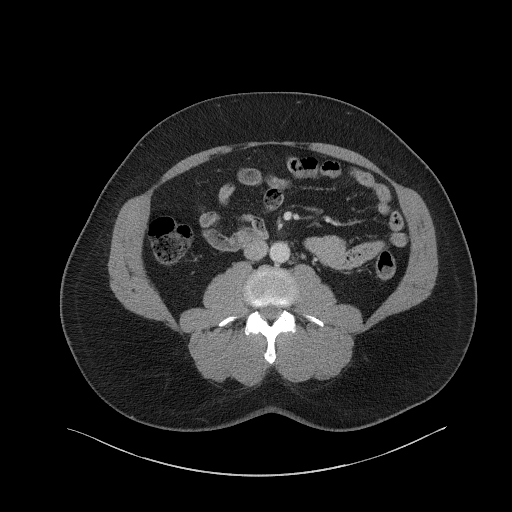
[im 70/105  soft-tissue]
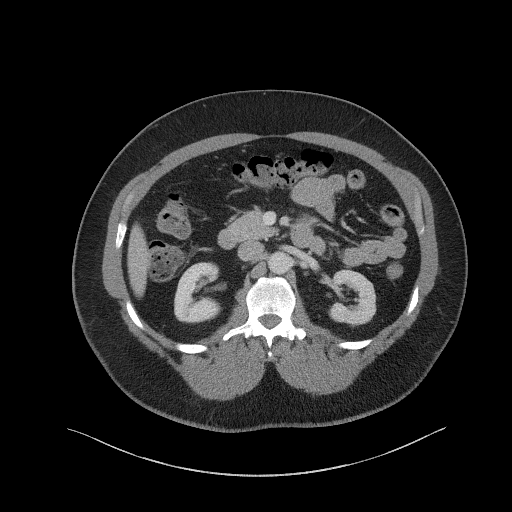
[im 70/105  bone]
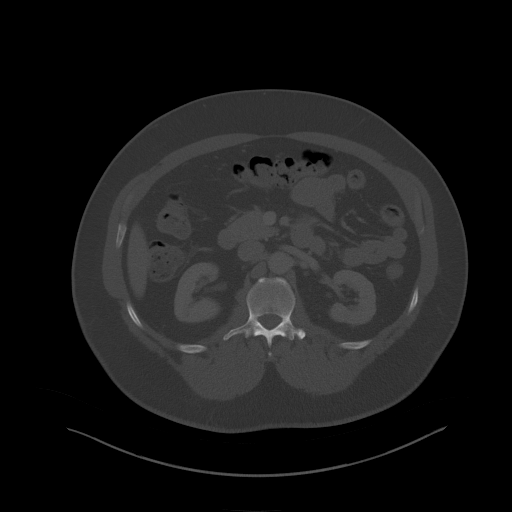
[im 75/105  soft-tissue]
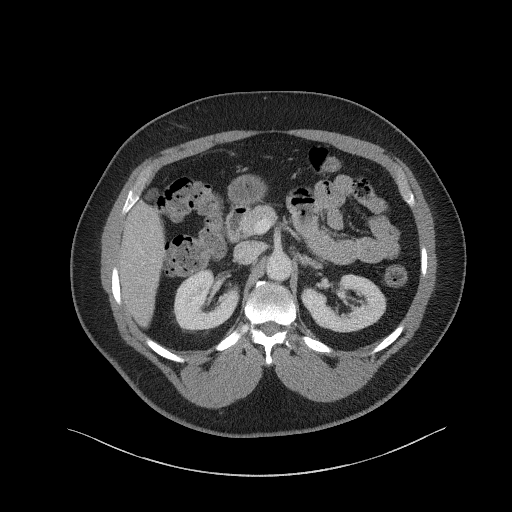
[im 85/105  soft-tissue]
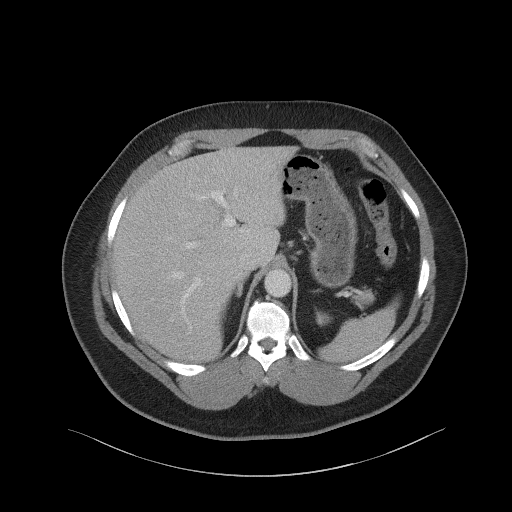
[im 90/105  soft-tissue]
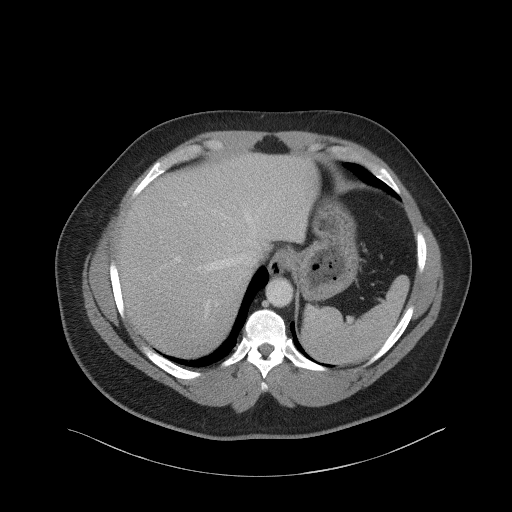
[im 100/105  soft-tissue]
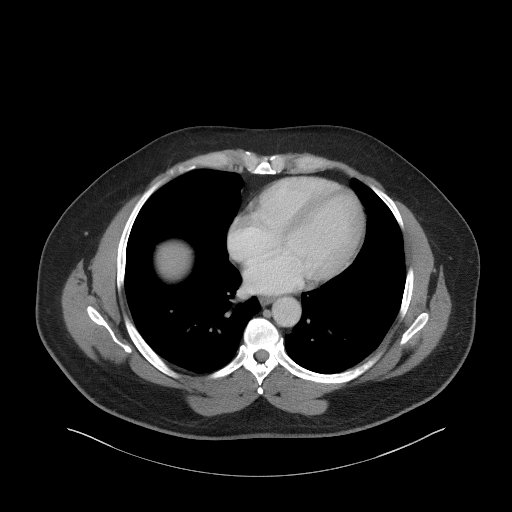

[Series 6: a/p w/ cor · coronal · 0.89mm/px · 3 of 161 slices shown]
[im 54/161  soft-tissue]
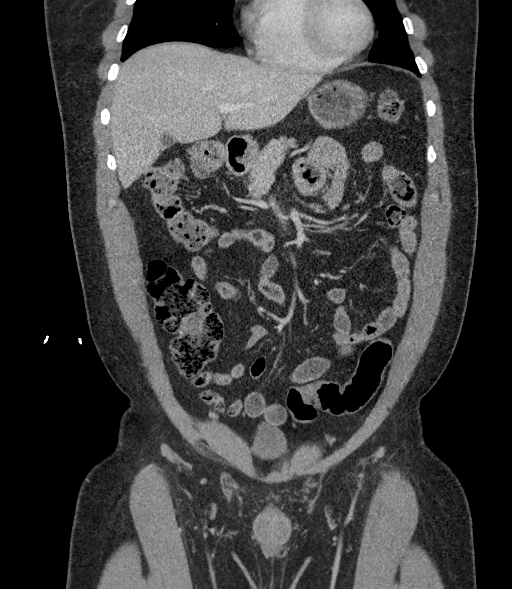
[im 72/161  soft-tissue]
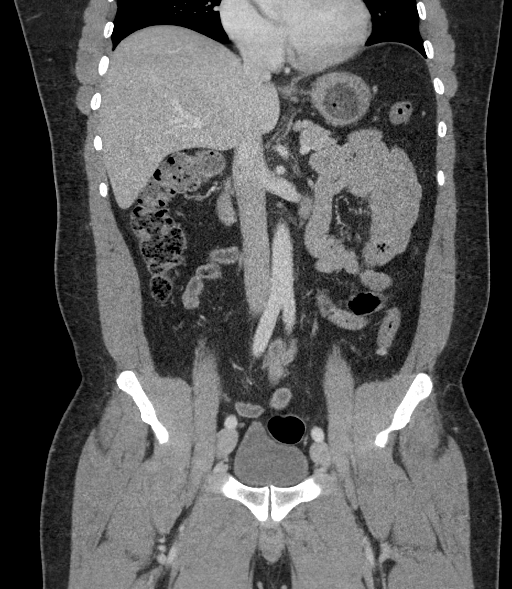
[im 89/161  soft-tissue]
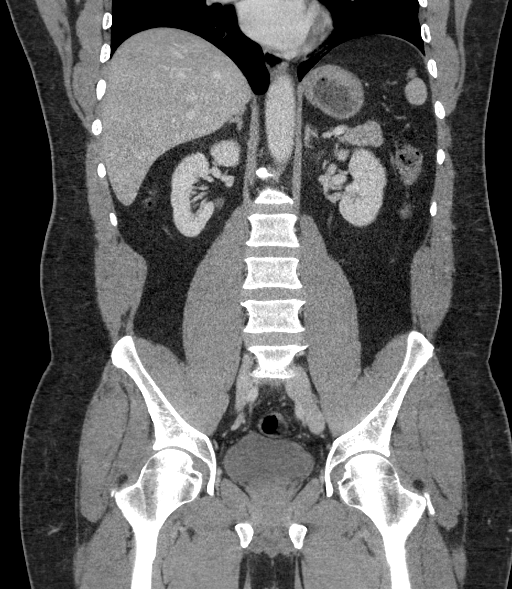

[16 of 46 positions shown; findings below may reference images not displayed]

FINDINGS: LOWER CHEST: There is no basilar pleural or apical pericardial
effusion.

HEPATOBILIARY: The hepatic contours and density are normal. There is
no intra- or extrahepatic biliary dilatation. The gallbladder is
normal.

PANCREAS: The pancreatic parenchymal contours are normal and there
is no ductal dilatation. There is no peripancreatic fluid
collection.

SPLEEN: Normal.

ADRENALS/URINARY TRACT:

--Adrenal glands: Normal.

--Right kidney/ureter: No hydronephrosis, nephroureterolithiasis,
perinephric stranding or solid renal mass.

--Left kidney/ureter: No hydronephrosis, nephroureterolithiasis,
perinephric stranding or solid renal mass.

--Urinary bladder: Normal for degree of distention

STOMACH/BOWEL:

--Stomach/Duodenum: There is no hiatal hernia or other gastric
abnormality. The duodenal course and caliber are normal.

--Small bowel: No dilatation or inflammation.

--Colon: No focal abnormality.

--Appendix: Not visualized. No right lower quadrant inflammation or
free fluid.

VASCULAR/LYMPHATIC: Normal course and caliber of the major abdominal
vessels. No abdominal or pelvic lymphadenopathy.

REPRODUCTIVE: Normal prostate size with symmetric seminal vesicles.

MUSCULOSKELETAL. No bony spinal canal stenosis or focal osseous
abnormality.

OTHER: None.
IMPRESSION: No acute abnormality of the abdomen or pelvis.

## 2020-11-15 DIAGNOSIS — G4733 Obstructive sleep apnea (adult) (pediatric): Secondary | ICD-10-CM | POA: Diagnosis not present

## 2020-11-30 DIAGNOSIS — Z13220 Encounter for screening for lipoid disorders: Secondary | ICD-10-CM | POA: Diagnosis not present

## 2020-11-30 DIAGNOSIS — Z125 Encounter for screening for malignant neoplasm of prostate: Secondary | ICD-10-CM | POA: Diagnosis not present

## 2020-11-30 DIAGNOSIS — E559 Vitamin D deficiency, unspecified: Secondary | ICD-10-CM | POA: Diagnosis not present

## 2020-11-30 DIAGNOSIS — Z6839 Body mass index (BMI) 39.0-39.9, adult: Secondary | ICD-10-CM | POA: Diagnosis not present

## 2020-11-30 DIAGNOSIS — R5383 Other fatigue: Secondary | ICD-10-CM | POA: Diagnosis not present

## 2020-11-30 DIAGNOSIS — Z0000 Encounter for general adult medical examination without abnormal findings: Secondary | ICD-10-CM | POA: Diagnosis not present

## 2020-12-26 DIAGNOSIS — I10 Essential (primary) hypertension: Secondary | ICD-10-CM | POA: Diagnosis not present

## 2020-12-26 DIAGNOSIS — R7302 Impaired glucose tolerance (oral): Secondary | ICD-10-CM | POA: Diagnosis not present

## 2020-12-26 DIAGNOSIS — M79672 Pain in left foot: Secondary | ICD-10-CM | POA: Diagnosis not present

## 2020-12-26 DIAGNOSIS — Z1331 Encounter for screening for depression: Secondary | ICD-10-CM | POA: Diagnosis not present

## 2021-02-17 DIAGNOSIS — I10 Essential (primary) hypertension: Secondary | ICD-10-CM | POA: Diagnosis not present

## 2021-02-17 DIAGNOSIS — G44009 Cluster headache syndrome, unspecified, not intractable: Secondary | ICD-10-CM | POA: Diagnosis not present

## 2021-02-17 DIAGNOSIS — Z131 Encounter for screening for diabetes mellitus: Secondary | ICD-10-CM | POA: Diagnosis not present

## 2021-02-17 DIAGNOSIS — G4733 Obstructive sleep apnea (adult) (pediatric): Secondary | ICD-10-CM | POA: Diagnosis not present

## 2021-02-17 DIAGNOSIS — M79652 Pain in left thigh: Secondary | ICD-10-CM | POA: Diagnosis not present

## 2021-03-01 DIAGNOSIS — I10 Essential (primary) hypertension: Secondary | ICD-10-CM | POA: Diagnosis not present

## 2021-03-01 DIAGNOSIS — M545 Low back pain, unspecified: Secondary | ICD-10-CM | POA: Diagnosis not present

## 2021-03-01 DIAGNOSIS — G4750 Parasomnia, unspecified: Secondary | ICD-10-CM | POA: Diagnosis not present

## 2021-03-01 DIAGNOSIS — G4733 Obstructive sleep apnea (adult) (pediatric): Secondary | ICD-10-CM | POA: Diagnosis not present

## 2021-03-02 DIAGNOSIS — I10 Essential (primary) hypertension: Secondary | ICD-10-CM | POA: Diagnosis not present

## 2021-03-02 DIAGNOSIS — G44009 Cluster headache syndrome, unspecified, not intractable: Secondary | ICD-10-CM | POA: Diagnosis not present

## 2021-03-02 DIAGNOSIS — M79652 Pain in left thigh: Secondary | ICD-10-CM | POA: Diagnosis not present

## 2021-03-02 DIAGNOSIS — E559 Vitamin D deficiency, unspecified: Secondary | ICD-10-CM | POA: Diagnosis not present

## 2021-03-10 DIAGNOSIS — R051 Acute cough: Secondary | ICD-10-CM | POA: Diagnosis not present

## 2021-04-17 IMAGING — CT CT ANGIO HEAD
2 of 4 series · 6 of 30 positions shown · IV contrast (APPLIED)
Comparison: None.

CLINICAL DATA: Headache with visual field loss. Temporal artery
tenderness.

EXAM:
CT ANGIOGRAPHY HEAD
TECHNIQUE: Multidetector CT imaging of the head was performed using the
standard protocol during bolus administration of intravenous
contrast. Multiplanar CT image reconstructions and MIPs were
obtained to evaluate the vascular anatomy.
CONTRAST:  75mL 3UZAOI-OBM IOPAMIDOL (3UZAOI-OBM) INJECTION 76%

[Series 3: head w/(date) · axial · 0.50mm/px · z∈[-147,-87]mm · 2 of 37 slices shown]
[im 13/37  brain]
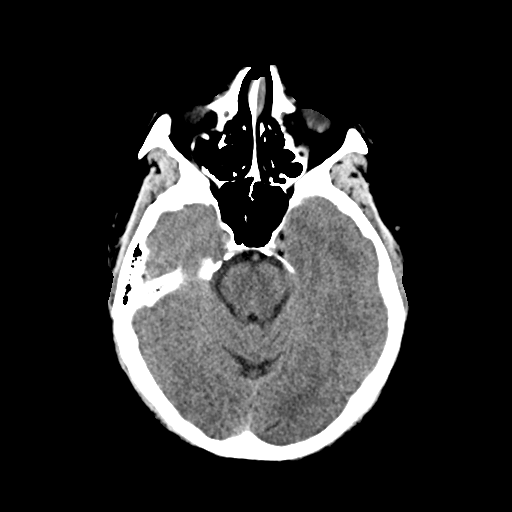
[im 25/37  brain]
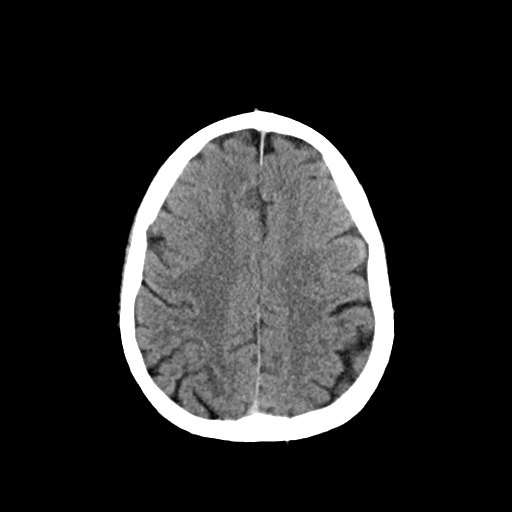

[Series 8: head angio · axial · 0.50mm/px · z∈[-172,-64]mm · 4 of 62 slices shown]
[im 13/62  brain]
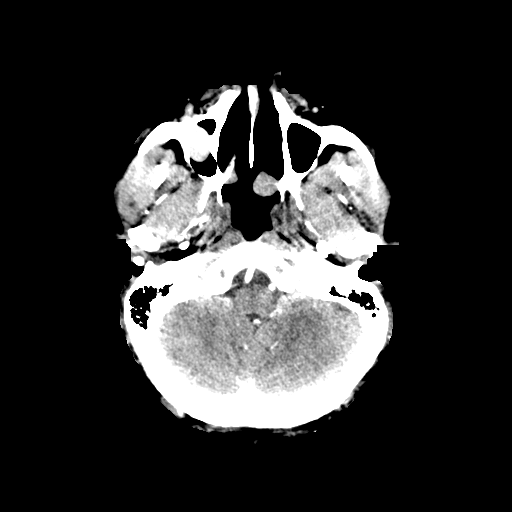
[im 25/62  bone]
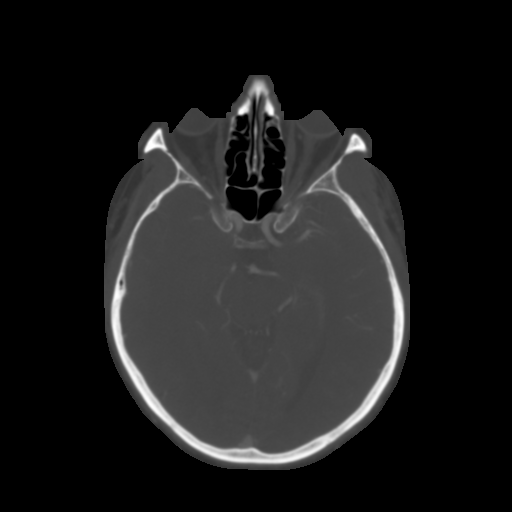
[im 37/62  brain]
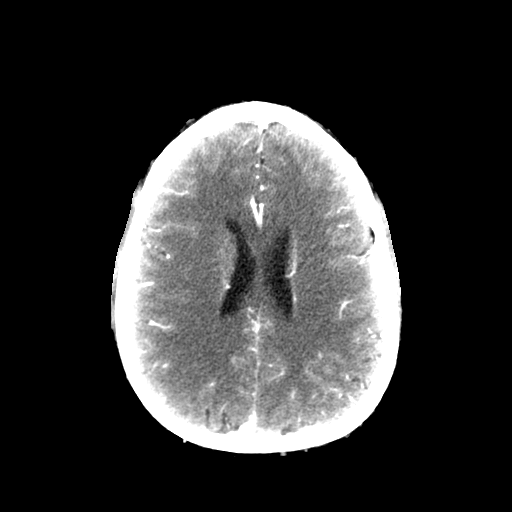
[im 49/62  bone]
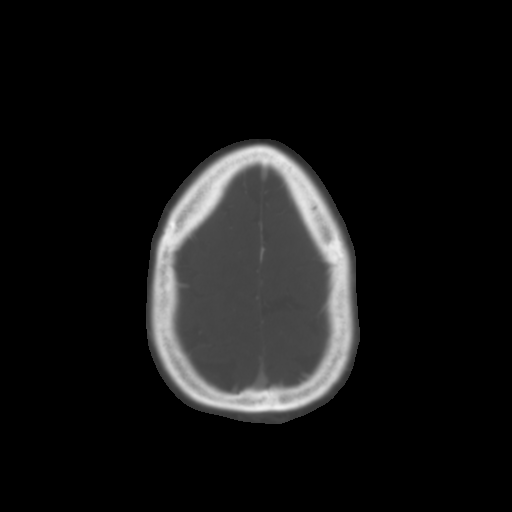

[6 of 30 positions shown; findings below may reference images not displayed]

FINDINGS: CT HEAD

Brain: No evidence of acute infarction, hemorrhage, hydrocephalus,
extra-axial collection or mass lesion/mass effect.

Vascular: Negative for hyperdense vessel

Skull: Negative

Sinuses: Cyst right maxillary sinus.

Orbits: Negative orbit. No orbital mass or edema.

CTA HEAD

Anterior circulation: Cavernous carotid normal bilaterally. No
stenosis or calcification. Negative for aneurysm. Anterior and
middle cerebral arteries normal bilaterally. No stenosis or
occlusion. No vessel irregularity.

Posterior circulation: Both vertebral arteries patent to the
basilar. PICA patent bilaterally. Basilar widely patent. Superior
cerebellar and posterior cerebral arteries normal bilaterally
without stenosis or aneurysm. No vessel irregularity.

Venous sinuses: Normal venous enhancement.

Anatomic variants: None
IMPRESSION: Negative CT head

Negative CTA head. No intracranial stenosis or occlusion. No
evidence of vasculitis.

## 2021-04-23 DIAGNOSIS — S0501XA Injury of conjunctiva and corneal abrasion without foreign body, right eye, initial encounter: Secondary | ICD-10-CM | POA: Diagnosis not present

## 2021-07-05 ENCOUNTER — Ambulatory Visit (HOSPITAL_COMMUNITY)
Admission: EM | Admit: 2021-07-05 | Discharge: 2021-07-05 | Disposition: A | Discharge status: Home / Self Care | Payer: BC Managed Care – PPO | Attending: Urgent Care | Admitting: Urgent Care

## 2021-07-05 ENCOUNTER — Other Ambulatory Visit: Payer: Self-pay

## 2021-07-05 ENCOUNTER — Encounter (HOSPITAL_COMMUNITY): Payer: Self-pay

## 2021-07-05 DIAGNOSIS — M545 Low back pain, unspecified: Secondary | ICD-10-CM | POA: Diagnosis not present

## 2021-07-05 DIAGNOSIS — S39012A Strain of muscle, fascia and tendon of lower back, initial encounter: Secondary | ICD-10-CM | POA: Diagnosis not present

## 2021-07-05 MED ORDER — MELOXICAM 15 MG PO TABS: 15.0000 mg | ORAL_TABLET | Freq: Every day | ORAL | 1 refills | Status: DC

## 2021-07-05 MED ORDER — TIZANIDINE HCL 4 MG PO TABS: 4.0000 mg | ORAL_TABLET | Freq: Every day | ORAL | 0 refills | Status: DC

## 2021-07-05 NOTE — ED Triage Notes (Signed)
Pt presents with pain in lower back that radiates down right leg X 2 days that is unrelieved with OTC medication.

## 2021-07-05 NOTE — ED Provider Notes (Signed)
[facility]   MRN: [id number] DOB: 02-04-1961  Subjective:   Craig Callahan is a 60 y.o. male presenting for 2-day history of acute onset persistent right-sided low back pain that radiates into the hip but also the thoracic flank side.  Symptoms started after he was lifting boxes of carpet in a very tight space and remembers that it felt very strange.  Has used some ibuprofen with minimal relief.  No current facility-administered medications for this encounter.  Current Outpatient Medications:    amLODipine (NORVASC) 5 MG tablet, Take 5 mg by mouth daily., Disp: , Rfl:    aspirin 81 MG chewable tablet, Chew by mouth., Disp: , Rfl:    diclofenac (VOLTAREN) 75 MG EC tablet, Take 1 tablet (75 mg total) by mouth 2 (two) times daily., Disp: 50 tablet, Rfl: 2   lisinopril-hydrochlorothiazide (PRINZIDE,ZESTORETIC) 20-12.5 MG tablet, Take 1 tablet by mouth daily., Disp: , Rfl:    Vitamin D, Ergocalciferol, (DRISDOL) 1.25 MG (50000 UNIT) CAPS capsule, Take 50,000 Units by mouth once a week., Disp: , Rfl:    Allergies  Allergen Reactions   Hydrocodone Itching    Past Medical History:  Diagnosis Date   Hypertension    Neuropathy    Vitamin D deficiency      Past Surgical History:  Procedure Laterality Date   ANKLE FRACTURE SURGERY Right    APPENDECTOMY     dermoid tumor Left    SHOULDER ARTHROSCOPY Left     Family History  Problem Relation Age of Onset   Heart disease Mother    Hypertension Mother    Lung cancer Father    Lupus Sister    Kidney failure Brother    Diabetes Brother    Healthy Sister    Hypertension Brother    Healthy Brother     Social History   Tobacco Use   Smoking status: Never   Smokeless tobacco: Never  Vaping Use   Vaping Use: Never used  Substance Use Topics   Alcohol use: Yes    Comment: Rarly. 1 glass once a month with dinner   Drug use: Never    ROS   Objective:   Vitals: BP 121/81 (BP Location: Right Arm)   Pulse  60   Temp 98 F (36.7 C) (Oral)   Resp 18   SpO2 96%   Physical Exam Constitutional:      General: He is not in acute distress.    Appearance: Normal appearance. He is well-developed. He is obese. He is not ill-appearing, toxic-appearing or diaphoretic.  HENT:     Head: Normocephalic and atraumatic.     Right Ear: External ear normal.     Left Ear: External ear normal.     Nose: Nose normal.     Mouth/Throat:     Pharynx: Oropharynx is clear.  Eyes:     General: No scleral icterus.       Right eye: No discharge.        Left eye: No discharge.     Extraocular Movements: Extraocular movements intact.     Pupils: Pupils are equal, round, and reactive to light.  Cardiovascular:     Rate and Rhythm: Normal rate.  Pulmonary:     Effort: Pulmonary effort is normal.  Musculoskeletal:     Cervical back: Normal range of motion.     Lumbar back: Spasms and tenderness present. No swelling, edema, deformity, signs of trauma, lacerations or bony tenderness. Decreased range of motion.  Negative right straight leg raise test and negative left straight leg raise test. No scoliosis.       Back:     Comments: Strength 5/5 for lower extremities.  Patient ambulates without assistance.    Neurological:     Mental Status: He is alert and oriented to person, place, and time.     Motor: No weakness.     Coordination: Coordination normal.     Gait: Gait normal.     Deep Tendon Reflexes: Reflexes normal.  Psychiatric:        Mood and Affect: Mood normal.        Behavior: Behavior normal.        Thought Content: Thought content normal.        Judgment: Judgment normal.    Assessment and Plan :   PDMP not reviewed this encounter.  1. Acute right-sided low back pain, unspecified whether sciatica present   2. Back strain, initial encounter    Deferred imaging given lack of trauma. Will manage conservatively for back strain with NSAID and muscle relaxant, rest and modification of physical  activity.  Anticipatory guidance provided.  Counseled patient on potential for adverse effects with medications prescribed/recommended today, ER and return-to-clinic precautions discussed, patient verbalized understanding.    Wallis Bamberg, [place] 07/06/21 [phone]

## 2021-07-11 DIAGNOSIS — I10 Essential (primary) hypertension: Secondary | ICD-10-CM | POA: Diagnosis not present

## 2021-07-11 DIAGNOSIS — R7302 Impaired glucose tolerance (oral): Secondary | ICD-10-CM | POA: Diagnosis not present

## 2021-07-19 DIAGNOSIS — L918 Other hypertrophic disorders of the skin: Secondary | ICD-10-CM | POA: Diagnosis not present

## 2021-07-19 DIAGNOSIS — L905 Scar conditions and fibrosis of skin: Secondary | ICD-10-CM | POA: Diagnosis not present

## 2021-07-20 DIAGNOSIS — M5451 Vertebrogenic low back pain: Secondary | ICD-10-CM | POA: Diagnosis not present

## 2021-07-28 DIAGNOSIS — M5451 Vertebrogenic low back pain: Secondary | ICD-10-CM | POA: Diagnosis not present

## 2021-11-20 DIAGNOSIS — G4733 Obstructive sleep apnea (adult) (pediatric): Secondary | ICD-10-CM | POA: Diagnosis not present

## 2021-11-20 DIAGNOSIS — J029 Acute pharyngitis, unspecified: Secondary | ICD-10-CM | POA: Diagnosis not present

## 2021-11-20 DIAGNOSIS — R051 Acute cough: Secondary | ICD-10-CM | POA: Diagnosis not present

## 2021-12-12 DIAGNOSIS — R0982 Postnasal drip: Secondary | ICD-10-CM | POA: Diagnosis not present

## 2021-12-12 DIAGNOSIS — R058 Other specified cough: Secondary | ICD-10-CM | POA: Diagnosis not present

## 2022-01-09 DIAGNOSIS — R7302 Impaired glucose tolerance (oral): Secondary | ICD-10-CM | POA: Diagnosis not present

## 2022-01-09 DIAGNOSIS — I10 Essential (primary) hypertension: Secondary | ICD-10-CM | POA: Diagnosis not present

## 2022-01-15 DIAGNOSIS — R7302 Impaired glucose tolerance (oral): Secondary | ICD-10-CM | POA: Diagnosis not present

## 2022-01-15 DIAGNOSIS — Z1339 Encounter for screening examination for other mental health and behavioral disorders: Secondary | ICD-10-CM | POA: Diagnosis not present

## 2022-01-15 DIAGNOSIS — Z1331 Encounter for screening for depression: Secondary | ICD-10-CM | POA: Diagnosis not present

## 2022-01-15 DIAGNOSIS — I10 Essential (primary) hypertension: Secondary | ICD-10-CM | POA: Diagnosis not present

## 2022-01-15 DIAGNOSIS — R82998 Other abnormal findings in urine: Secondary | ICD-10-CM | POA: Diagnosis not present

## 2022-01-15 DIAGNOSIS — Z0000 Encounter for general adult medical examination without abnormal findings: Secondary | ICD-10-CM | POA: Diagnosis not present

## 2022-03-01 DIAGNOSIS — M545 Low back pain, unspecified: Secondary | ICD-10-CM | POA: Diagnosis not present

## 2022-03-01 DIAGNOSIS — I10 Essential (primary) hypertension: Secondary | ICD-10-CM | POA: Diagnosis not present

## 2022-03-01 DIAGNOSIS — G4733 Obstructive sleep apnea (adult) (pediatric): Secondary | ICD-10-CM | POA: Diagnosis not present

## 2022-03-01 DIAGNOSIS — R7303 Prediabetes: Secondary | ICD-10-CM | POA: Diagnosis not present

## 2022-04-02 DIAGNOSIS — G4733 Obstructive sleep apnea (adult) (pediatric): Secondary | ICD-10-CM | POA: Diagnosis not present

## 2022-04-02 DIAGNOSIS — Z6837 Body mass index (BMI) 37.0-37.9, adult: Secondary | ICD-10-CM | POA: Diagnosis not present

## 2022-04-02 DIAGNOSIS — I10 Essential (primary) hypertension: Secondary | ICD-10-CM | POA: Diagnosis not present

## 2022-04-02 DIAGNOSIS — M545 Low back pain, unspecified: Secondary | ICD-10-CM | POA: Diagnosis not present

## 2022-05-25 DIAGNOSIS — Z6837 Body mass index (BMI) 37.0-37.9, adult: Secondary | ICD-10-CM | POA: Diagnosis not present

## 2022-05-25 DIAGNOSIS — I10 Essential (primary) hypertension: Secondary | ICD-10-CM | POA: Diagnosis not present

## 2022-05-25 DIAGNOSIS — M79605 Pain in left leg: Secondary | ICD-10-CM | POA: Diagnosis not present

## 2022-05-25 DIAGNOSIS — R7303 Prediabetes: Secondary | ICD-10-CM | POA: Diagnosis not present

## 2022-05-25 DIAGNOSIS — R079 Chest pain, unspecified: Secondary | ICD-10-CM | POA: Diagnosis not present

## 2022-05-25 DIAGNOSIS — R030 Elevated blood-pressure reading, without diagnosis of hypertension: Secondary | ICD-10-CM | POA: Diagnosis not present

## 2022-05-25 DIAGNOSIS — Z79899 Other long term (current) drug therapy: Secondary | ICD-10-CM | POA: Diagnosis not present

## 2022-06-01 DIAGNOSIS — R7303 Prediabetes: Secondary | ICD-10-CM | POA: Diagnosis not present

## 2022-06-01 DIAGNOSIS — G4733 Obstructive sleep apnea (adult) (pediatric): Secondary | ICD-10-CM | POA: Diagnosis not present

## 2022-06-01 DIAGNOSIS — Z6837 Body mass index (BMI) 37.0-37.9, adult: Secondary | ICD-10-CM | POA: Diagnosis not present

## 2022-06-01 DIAGNOSIS — I10 Essential (primary) hypertension: Secondary | ICD-10-CM | POA: Diagnosis not present

## 2022-06-19 DIAGNOSIS — L309 Dermatitis, unspecified: Secondary | ICD-10-CM | POA: Diagnosis not present

## 2022-07-19 DIAGNOSIS — R7302 Impaired glucose tolerance (oral): Secondary | ICD-10-CM | POA: Diagnosis not present

## 2022-07-19 DIAGNOSIS — I10 Essential (primary) hypertension: Secondary | ICD-10-CM | POA: Diagnosis not present

## 2022-08-03 ENCOUNTER — Ambulatory Visit (INDEPENDENT_AMBULATORY_CARE_PROVIDER_SITE_OTHER): Payer: BC Managed Care – PPO

## 2022-08-03 ENCOUNTER — Ambulatory Visit (HOSPITAL_COMMUNITY)
Admission: EM | Admit: 2022-08-03 | Discharge: 2022-08-03 | Disposition: A | Discharge status: Home / Self Care | Payer: BC Managed Care – PPO | Attending: Emergency Medicine | Admitting: Emergency Medicine

## 2022-08-03 ENCOUNTER — Encounter (HOSPITAL_COMMUNITY): Payer: Self-pay

## 2022-08-03 DIAGNOSIS — S022XXA Fracture of nasal bones, initial encounter for closed fracture: Secondary | ICD-10-CM

## 2022-08-03 MED ORDER — IBUPROFEN 800 MG PO TABS
800.0000 mg | ORAL_TABLET | Freq: Once | ORAL | Status: AC
  Administered 2022-08-03: 800 mg via ORAL

## 2022-08-03 MED ORDER — IBUPROFEN 800 MG PO TABS
ORAL_TABLET | ORAL | Status: AC
  Filled 2022-08-03: qty 1
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
## 2022-08-03 NOTE — Discharge Instructions (Addendum)
Follow up with ENT, please call and schedule an appointment.   You can take ibuprofen every 6 hours (400-600 mg) , do not take more than 2400 mg in a 24-hour day.  I advised that you do not take ibuprofen on an empty stomach, ibuprofen can cause GI problems such as GI bleeding.
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
## 2022-08-22 ENCOUNTER — Other Ambulatory Visit (HOSPITAL_BASED_OUTPATIENT_CLINIC_OR_DEPARTMENT_OTHER): Payer: Self-pay | Admitting: Registered Nurse

## 2022-08-22 DIAGNOSIS — R1012 Left upper quadrant pain: Secondary | ICD-10-CM | POA: Diagnosis not present

## 2022-08-22 DIAGNOSIS — I10 Essential (primary) hypertension: Secondary | ICD-10-CM | POA: Diagnosis not present

## 2022-08-23 ENCOUNTER — Ambulatory Visit (HOSPITAL_BASED_OUTPATIENT_CLINIC_OR_DEPARTMENT_OTHER)
Admission: RE | Admit: 2022-08-23 | Discharge: 2022-08-23 | Disposition: A | Discharge status: Home / Self Care | Payer: BC Managed Care – PPO | Source: Ambulatory Visit | Attending: Registered Nurse | Admitting: Registered Nurse

## 2022-08-23 DIAGNOSIS — R1012 Left upper quadrant pain: Secondary | ICD-10-CM | POA: Diagnosis not present

## 2022-08-23 DIAGNOSIS — K760 Fatty (change of) liver, not elsewhere classified: Secondary | ICD-10-CM | POA: Diagnosis not present

## 2022-08-31 DIAGNOSIS — G4733 Obstructive sleep apnea (adult) (pediatric): Secondary | ICD-10-CM | POA: Diagnosis not present

## 2022-08-31 DIAGNOSIS — R7303 Prediabetes: Secondary | ICD-10-CM | POA: Diagnosis not present

## 2022-08-31 DIAGNOSIS — I10 Essential (primary) hypertension: Secondary | ICD-10-CM | POA: Diagnosis not present

## 2022-08-31 DIAGNOSIS — Z6836 Body mass index (BMI) 36.0-36.9, adult: Secondary | ICD-10-CM | POA: Diagnosis not present

## 2022-09-19 DIAGNOSIS — J3489 Other specified disorders of nose and nasal sinuses: Secondary | ICD-10-CM | POA: Diagnosis not present

## 2022-09-19 DIAGNOSIS — J309 Allergic rhinitis, unspecified: Secondary | ICD-10-CM | POA: Diagnosis not present

## 2022-09-19 DIAGNOSIS — S0992XA Unspecified injury of nose, initial encounter: Secondary | ICD-10-CM | POA: Diagnosis not present

## 2022-12-12 DIAGNOSIS — G4733 Obstructive sleep apnea (adult) (pediatric): Secondary | ICD-10-CM | POA: Diagnosis not present

## 2022-12-12 DIAGNOSIS — R7303 Prediabetes: Secondary | ICD-10-CM | POA: Diagnosis not present

## 2022-12-12 DIAGNOSIS — Z6838 Body mass index (BMI) 38.0-38.9, adult: Secondary | ICD-10-CM | POA: Diagnosis not present

## 2022-12-12 DIAGNOSIS — I10 Essential (primary) hypertension: Secondary | ICD-10-CM | POA: Diagnosis not present

## 2023-03-14 DIAGNOSIS — E559 Vitamin D deficiency, unspecified: Secondary | ICD-10-CM | POA: Diagnosis not present

## 2023-03-14 DIAGNOSIS — I10 Essential (primary) hypertension: Secondary | ICD-10-CM | POA: Diagnosis not present

## 2023-03-14 DIAGNOSIS — Z6839 Body mass index (BMI) 39.0-39.9, adult: Secondary | ICD-10-CM | POA: Diagnosis not present

## 2023-03-14 DIAGNOSIS — G4733 Obstructive sleep apnea (adult) (pediatric): Secondary | ICD-10-CM | POA: Diagnosis not present

## 2023-03-14 DIAGNOSIS — R030 Elevated blood-pressure reading, without diagnosis of hypertension: Secondary | ICD-10-CM | POA: Diagnosis not present

## 2023-03-14 DIAGNOSIS — G4750 Parasomnia, unspecified: Secondary | ICD-10-CM | POA: Diagnosis not present

## 2023-04-22 DIAGNOSIS — Z114 Encounter for screening for human immunodeficiency virus [HIV]: Secondary | ICD-10-CM | POA: Diagnosis not present

## 2023-04-22 DIAGNOSIS — M542 Cervicalgia: Secondary | ICD-10-CM | POA: Diagnosis not present

## 2023-04-22 DIAGNOSIS — E559 Vitamin D deficiency, unspecified: Secondary | ICD-10-CM | POA: Diagnosis not present

## 2023-04-22 DIAGNOSIS — M129 Arthropathy, unspecified: Secondary | ICD-10-CM | POA: Diagnosis not present

## 2023-04-22 DIAGNOSIS — Z0000 Encounter for general adult medical examination without abnormal findings: Secondary | ICD-10-CM | POA: Diagnosis not present

## 2023-04-22 DIAGNOSIS — R0602 Shortness of breath: Secondary | ICD-10-CM | POA: Diagnosis not present

## 2023-04-22 DIAGNOSIS — Z125 Encounter for screening for malignant neoplasm of prostate: Secondary | ICD-10-CM | POA: Diagnosis not present

## 2023-04-22 DIAGNOSIS — Z13220 Encounter for screening for lipoid disorders: Secondary | ICD-10-CM | POA: Diagnosis not present

## 2023-04-22 DIAGNOSIS — R5383 Other fatigue: Secondary | ICD-10-CM | POA: Diagnosis not present

## 2023-04-22 DIAGNOSIS — Z6839 Body mass index (BMI) 39.0-39.9, adult: Secondary | ICD-10-CM | POA: Diagnosis not present

## 2023-04-22 DIAGNOSIS — I10 Essential (primary) hypertension: Secondary | ICD-10-CM | POA: Diagnosis not present

## 2023-04-22 DIAGNOSIS — D539 Nutritional anemia, unspecified: Secondary | ICD-10-CM | POA: Diagnosis not present

## 2023-04-22 DIAGNOSIS — Z131 Encounter for screening for diabetes mellitus: Secondary | ICD-10-CM | POA: Diagnosis not present

## 2023-05-06 DIAGNOSIS — M542 Cervicalgia: Secondary | ICD-10-CM | POA: Diagnosis not present

## 2023-05-06 DIAGNOSIS — E119 Type 2 diabetes mellitus without complications: Secondary | ICD-10-CM | POA: Diagnosis not present

## 2023-05-06 DIAGNOSIS — Z6839 Body mass index (BMI) 39.0-39.9, adult: Secondary | ICD-10-CM | POA: Diagnosis not present

## 2023-05-06 DIAGNOSIS — I10 Essential (primary) hypertension: Secondary | ICD-10-CM | POA: Diagnosis not present

## 2023-05-06 DIAGNOSIS — G4733 Obstructive sleep apnea (adult) (pediatric): Secondary | ICD-10-CM | POA: Diagnosis not present

## 2023-06-03 DIAGNOSIS — M549 Dorsalgia, unspecified: Secondary | ICD-10-CM | POA: Diagnosis not present

## 2023-06-03 DIAGNOSIS — Z6839 Body mass index (BMI) 39.0-39.9, adult: Secondary | ICD-10-CM | POA: Diagnosis not present

## 2023-06-26 ENCOUNTER — Encounter: Payer: Self-pay | Admitting: Podiatry

## 2023-06-26 ENCOUNTER — Ambulatory Visit: Payer: BC Managed Care – PPO | Admitting: Podiatry

## 2023-06-26 DIAGNOSIS — M722 Plantar fascial fibromatosis: Secondary | ICD-10-CM | POA: Diagnosis not present

## 2023-06-26 NOTE — Patient Instructions (Signed)

## 2023-06-27 DIAGNOSIS — M722 Plantar fascial fibromatosis: Secondary | ICD-10-CM | POA: Diagnosis not present

## 2023-06-27 MED ORDER — TRIAMCINOLONE ACETONIDE 10 MG/ML IJ SUSP
10.0000 mg | Freq: Once | INTRAMUSCULAR | Status: AC
  Administered 2023-06-27: 10 mg via INTRA_ARTICULAR

## 2023-06-27 NOTE — Progress Notes (Signed)
**Note Craig-Identified via Obfuscation** Subjective:   Patient ID: Craig Callahan, male   DOB: 62 y.o.   MRN: [id number]   HPI Patient presents with a lot of pain in the plantar aspect of the left heel and states that it has been going on now for several months and that he has tried shoe gear modifications and stretching.  Patient does not smoke likes to be active   Review of Systems  All other systems reviewed and are negative.       Objective:  Physical Exam Vitals and nursing note reviewed.  Constitutional:      Appearance: He is well-developed.  Pulmonary:     Effort: Pulmonary effort is normal.  Musculoskeletal:        General: Normal range of motion.  Skin:    General: Skin is warm.  Neurological:     Mental Status: He is alert.     Neurovascular status intact muscle strength found to be adequate range of motion adequate with exquisite discomfort medial fascial band left at the insertional point of the tendon into the calcaneus with inflammation fluid around the medial band.  Good digital perfusion well-oriented     Assessment:  Acute plantar fasciitis left with inflammation fluid of the medial band     Plan:  H&P reviewed condition and I went ahead today did sterile prep injected the medial insertion 3 mg Kenalog 5 mg Xylocaine applied fascial brace with instructions on usage fitted properly and instructed on shoe gear modifications physical therapy.  Placed on diclofenac 75 mg twice daily reappoint 2 weeks to recheck

## 2023-07-12 ENCOUNTER — Encounter: Payer: Self-pay | Admitting: Podiatry

## 2023-07-12 ENCOUNTER — Ambulatory Visit: Payer: BC Managed Care – PPO | Admitting: Podiatry

## 2023-07-12 DIAGNOSIS — M722 Plantar fascial fibromatosis: Secondary | ICD-10-CM | POA: Diagnosis not present

## 2023-07-12 MED ORDER — TRIAMCINOLONE ACETONIDE 10 MG/ML IJ SUSP
10.0000 mg | Freq: Once | INTRAMUSCULAR | Status: AC
  Administered 2023-07-12: 10 mg via INTRA_ARTICULAR

## 2023-07-12 MED ORDER — DICLOFENAC SODIUM 75 MG PO TBEC: 75.0000 mg | DELAYED_RELEASE_TABLET | Freq: Two times a day (BID) | ORAL | 2 refills | Status: DC

## 2023-07-14 NOTE — Progress Notes (Signed)
**Note Craig-Identified via Obfuscation** Subjective:   Patient ID: Craig Callahan, male   DOB: 62 y.o.   MRN: [id number]   HPI Patient states he is improving but he is still getting an area on the bottom of the left heel that is bothersome and hard to wear shoe gear with    ROS      Objective:  Physical Exam  Neurovascular status intact with plantar pain heel left that is improved but is still quite tender with deep palpation     Assessment:  Acute plantar fasciitis left with inflammation fluid buildup still present improved but not where I want     Plan:  H&P reviewed went ahead today did sterile prep injected the plantar fascia 3 mg Kenalog 5 mg Xylocaine discussed possible orthotics and we will see patient back as symptoms indicate

## 2023-10-21 ENCOUNTER — Other Ambulatory Visit: Payer: Self-pay | Admitting: Podiatry

## 2023-11-04 ENCOUNTER — Ambulatory Visit (INDEPENDENT_AMBULATORY_CARE_PROVIDER_SITE_OTHER): Admitting: Podiatry

## 2023-11-04 ENCOUNTER — Ambulatory Visit: Admitting: Podiatry

## 2023-11-04 ENCOUNTER — Encounter: Payer: Self-pay | Admitting: Podiatry

## 2023-11-04 DIAGNOSIS — M722 Plantar fascial fibromatosis: Secondary | ICD-10-CM | POA: Diagnosis not present

## 2023-11-04 DIAGNOSIS — Z91199 Patient's noncompliance with other medical treatment and regimen due to unspecified reason: Secondary | ICD-10-CM

## 2023-11-04 MED ORDER — TRIAMCINOLONE ACETONIDE 10 MG/ML IJ SUSP
2.5000 mg | Freq: Once | INTRAMUSCULAR | Status: AC
  Administered 2023-11-04: 2.5 mg via INTRA_ARTICULAR

## 2023-11-04 MED ORDER — DEXAMETHASONE SODIUM PHOSPHATE 120 MG/30ML IJ SOLN
4.0000 mg | Freq: Once | INTRAMUSCULAR | Status: AC
  Administered 2023-11-04: 4 mg via INTRA_ARTICULAR

## 2023-11-04 NOTE — Progress Notes (Signed)
  Subjective:  Patient ID: Craig Callahan, male    DOB: [birth year],   MRN: [id number]  No chief complaint on file.   63 y.o. male presents for concern of left foot plantar fasciitis. Has been following with Dr. Charlsie Merles. Relates heel pain on the left with first steps after rest and relates it has not gotten much better. Has had a couple injections and relates the last one did help for a while but not for long. Wondering what else he can do for the pain. Has been taking diclofenac to help as well.  . Denies any other pedal complaints. Denies n/v/f/c.   Past Medical History:  Diagnosis Date   Hypertension    Neuropathy    Vitamin D deficiency     Objective:  Physical Exam: Vascular: DP/PT pulses 2/4 bilateral. CFT <3 seconds. Normal hair growth on digits. No edema.  Skin. No lacerations or abrasions bilateral feet.  Musculoskeletal: MMT 5/5 bilateral lower extremities in DF, PF, Inversion and Eversion. Deceased ROM in DF of ankle joint. Tender to the medial calcaneal tubercle left . No pain with achilles, PT or arch. No pain with calcaneal squeeze.  Neurological: Sensation intact to light touch.   Assessment:   1. Plantar fasciitis of left foot      Plan:  Patient was evaluated and treated and all questions answered. Discussed plantar fasciitis with patient.  Discussed treatment options including, ice, NSAIDS, supportive shoes, bracing, and stretching. Stretching exercises provided to be done on a daily basis.   Patient requesting injection today. Procedure note below.   Follow-up 6 weeks or sooner if any problems arise. In the meantime, encouraged to call the office with any questions, concerns, change in symptoms.   Procedure:  Discussed etiology, pathology, conservative vs. surgical therapies. At this time a plantar fascial injection was recommended.  The patient agreed and a sterile skin prep was applied.  An injection consisting of  1cc dexamethasone 0.5 cc kenalog and 1cc marcaine  mixture was infiltrated at the point of maximal tenderness on the left Heel.  Bandaid applied. The patient tolerated this well and was given instructions for aftercare.    Craig Callahan, DPM

## 2023-11-04 NOTE — Progress Notes (Signed)
 No show

## 2023-11-04 NOTE — Patient Instructions (Signed)

## 2023-11-26 ENCOUNTER — Telehealth: Payer: Self-pay | Admitting: Cardiology

## 2023-11-27 ENCOUNTER — Ambulatory Visit: Admitting: Podiatry

## 2023-11-27 DIAGNOSIS — G5752 Tarsal tunnel syndrome, left lower limb: Secondary | ICD-10-CM | POA: Diagnosis not present

## 2023-11-27 DIAGNOSIS — M722 Plantar fascial fibromatosis: Secondary | ICD-10-CM

## 2023-11-27 NOTE — Progress Notes (Signed)
 Subjective:  Patient ID: Craig Callahan, male    DOB: 07-08-1961,  MRN: [id number]  Chief Complaint  Patient presents with   Numbness    63 y.o. male presents with the above complaint.  Patient presents with left heel pain that has been there for quite some time is progressive gotten worse worse with ambulation worse with pressure.  Patient states painful to touch denies any other acute complaints he also has secondary complaint of tarsal tunnel syndrome.  He feels like there is shooting burning going all the way to the bottom of the foot he has not seen anyone as per the significant scale 5 out of 10 dull aching nature.   Review of Systems: Negative except as noted in the HPI. Denies N/V/F/Ch.  Past Medical History:  Diagnosis Date   Hypertension    Neuropathy    Vitamin D deficiency     Current Outpatient Medications:    amLODipine (NORVASC) 5 MG tablet, Take 5 mg by mouth daily., Disp: , Rfl:    aspirin 81 MG chewable tablet, Chew by mouth., Disp: , Rfl:    diclofenac  (VOLTAREN ) 75 MG EC tablet, Take 1 tablet by mouth twice daily, Disp: 50 tablet, Rfl: 0   lisinopril-hydrochlorothiazide (PRINZIDE,ZESTORETIC) 20-12.5 MG tablet, Take 1 tablet by mouth daily., Disp: , Rfl:    meloxicam  (MOBIC ) 15 MG tablet, Take 1 tablet (15 mg total) by mouth daily., Disp: 30 tablet, Rfl: 1   Vitamin D, Ergocalciferol, (DRISDOL) 1.25 MG (50000 UNIT) CAPS capsule, Take 50,000 Units by mouth once a week., Disp: , Rfl:   Social History   Tobacco Use  Smoking Status Never  Smokeless Tobacco Never    Allergies  Allergen Reactions   Hydrocodone Itching   Objective:  There were no vitals filed for this visit. There is no height or weight on file to calculate BMI. Constitutional Well developed. Well nourished.  Vascular Dorsalis pedis pulses palpable bilaterally. Posterior tibial pulses palpable bilaterally. Capillary refill normal to all digits.  No cyanosis or clubbing noted. Pedal hair  growth normal.  Neurologic Normal speech. Oriented to person, place, and time. Epicritic sensation to light touch grossly present bilaterally.  Dermatologic Nails well groomed and normal in appearance. No open wounds. No skin lesions.  Orthopedic: Normal joint ROM without pain or crepitus bilaterally. No visible deformities. Tender to palpation at the calcaneal tuber left. No pain with calcaneal squeeze left. Ankle ROM diminished range of motion left. Silfverskiold Test: positive left.   Radiographs: None  Assessment:   1. Tarsal tunnel syndrome, left   2. Plantar fasciitis of left foot    Plan:  Patient was evaluated and treated and all questions answered.  Left tarsal tunnel syndrome - All questions and concerns were discussed for tarsal tunnel syndrome given the amount of burning and shooting he is experiencing on the bottom of the foot in the setting of positive Tinel sign I believe patient would benefit from nerve conduction study he states understand would like to proceed with nerve conduction study - Nerve conduction study was ordered  Plantar Fasciitis, left - XR reviewed as above.  - Educated on icing and stretching. Instructions given.  - Injection delivered to the plantar fascia as below. - DME: Plantar fascial brace dispensed to support the medial longitudinal arch of the foot and offload pressure from the heel and prevent arch collapse during weightbearing - Pharmacologic management: None  Procedure: Injection Tendon/Ligament Location: Left plantar fascia at the glabrous junction; medial approach. Skin Prep:  alcohol  Injectate: 0.5 cc 0.5% marcaine plain, 0.5 cc of 1% Lidocaine, 0.5 cc kenalog  10. Disposition: Patient tolerated procedure well. Injection site dressed with a band-aid.  No follow-ups on file.  Left plantar fascitis injection brace   Left tarsal tunnel syndrome nerve conduction study

## 2023-12-15 ENCOUNTER — Encounter: Payer: Self-pay | Admitting: Podiatry

## 2024-03-18 ENCOUNTER — Encounter: Payer: Self-pay | Admitting: Cardiology

## 2024-03-18 ENCOUNTER — Ambulatory Visit: Attending: Cardiology | Admitting: Cardiology

## 2024-03-18 VITALS — BP 100/66 | HR 80 | Ht 71.0 in | Wt 282.0 lb

## 2024-03-18 DIAGNOSIS — R072 Precordial pain: Secondary | ICD-10-CM | POA: Diagnosis not present

## 2024-03-18 DIAGNOSIS — Z79899 Other long term (current) drug therapy: Secondary | ICD-10-CM

## 2024-03-18 DIAGNOSIS — I10 Essential (primary) hypertension: Secondary | ICD-10-CM | POA: Diagnosis not present

## 2024-03-18 MED ORDER — NITROGLYCERIN 0.4 MG SL SUBL: 0.4000 mg | SUBLINGUAL_TABLET | SUBLINGUAL | 3 refills | Status: AC | PRN

## 2024-03-18 NOTE — Patient Instructions (Addendum)
 Medication Instructions:  Please START taking sublingual nitroglycerin  as needed for chest pain.  *If you need a refill on your cardiac medications before your next appointment, please call your pharmacy*  Lab Work: Please go to our first floor lab before you leave today for a CBC, BMET, lipid panel and A1c.  If you have labs (blood work) drawn today and your tests are completely normal, you will receive your results only by: MyChart Message (if you have MyChart) OR A paper copy in the mail If you have any lab test that is abnormal or we need to change your treatment, we will call you to review the results.  Testing/Procedures: Your physician has requested that you have an echocardiogram. Echocardiography is a painless test that uses sound waves to create images of your heart. It provides your doctor with information about the size and shape of your heart and how well your heart's chambers and valves are working. This procedure takes approximately one hour. There are no restrictions for this procedure. Please do NOT wear cologne, perfume, aftershave, or lotions (deodorant is allowed). Please arrive 15 minutes prior to your appointment time.  Please note: We ask at that you not bring children with you during ultrasound (echo/ vascular) testing. Due to room size and safety concerns, children are not allowed in the ultrasound rooms during exams. Our front office staff cannot provide observation of children in our lobby area while testing is being conducted. An adult accompanying a patient to their appointment will only be allowed in the ultrasound room at the discretion of the ultrasound technician under special circumstances. We apologize for any inconvenience.     Please report to Radiology at the [facility] Main Entrance 30 minutes early for your test.  [address] [place], [place] [address]                 How to Prepare for Your Cardiac PET/CT Stress Test:  Nothing to  eat or drink, except water, 3 hours prior to arrival time.  NO caffeine/decaffeinated products, or chocolate 12 hours prior to arrival. (Please note decaffeinated beverages (teas/coffees) still contain caffeine).  If you have caffeine within 12 hours prior, the test will need to be rescheduled.  Medication instructions: Do not take erectile dysfunction medications for 72 hours prior to test (sildenafil, tadalafil) Do not take nitrates (isosorbide mononitrate, Ranexa) the day before or day of test Do not take tamsulosin the day before or morning of test Hold theophylline containing medications for 12 hours. Hold Dipyridamole 48 hours prior to the test.  You may take your remaining medications with water.  NO perfume, cologne or lotion on chest or abdomen area.   Total time is 1 to 2 hours; you may want to bring reading material for the waiting time.  IF YOU THINK YOU MAY BE PREGNANT, OR ARE NURSING PLEASE INFORM THE TECHNOLOGIST.  In preparation for your appointment, medication and supplies will be purchased.  Appointment availability is limited, so if you need to cancel or reschedule, please call the Radiology Department Scheduler at [phone] 24 hours in advance to avoid a cancellation fee of $100.00  What to Expect When you Arrive:  Once you arrive and check in for your appointment, you will be taken to a preparation room within the Radiology Department.  A technologist or Nurse will obtain your medical history, verify that you are correctly prepped for the exam, and explain the procedure.  Afterwards, an IV will be started  in your arm and electrodes will be placed on your skin for EKG monitoring during the stress portion of the exam. Then you will be escorted to the PET/CT scanner.  There, staff will get you positioned on the scanner and obtain a blood pressure and EKG.  During the exam, you will continue to be connected to the EKG and blood pressure machines.  A small, safe amount of a  radioactive tracer will be injected in your IV to obtain a series of pictures of your heart along with an injection of a stress agent.    After your Exam:  It is recommended that you eat a meal and drink a caffeinated beverage to counter act any effects of the stress agent.  Drink plenty of fluids for the remainder of the day and urinate frequently for the first couple of hours after the exam.  Your doctor will inform you of your test results within 7-10 business days.  For more information and frequently asked questions, please visit our website: [email or web address]  For questions about your test or how to prepare for your test, please call: Cardiac Imaging Nurse Navigators Office: [phone]   Follow-Up: At [facility], you and your health needs are our priority.  As part of our continuing mission to provide you with exceptional heart care, our providers are all part of one team.  This team includes your primary Cardiologist (physician) and Advanced Practice Providers or APPs (Physician Assistants and Nurse Practitioners) who all work together to provide you with the care you need, when you need it.  Your next appointment:   3 month(s)  Provider:   Dr. Newman Lawrence, MD    Other Instructions Diet & Lifestyle recommendations:  Physical activity recommendation (The Physical Activity Guidelines for Americans. JAMA 2018;Nov 12) At least 150-300 minutes a week of moderate-intensity, or 75-150 minutes a week of vigorous-intensity aerobic physical activity, or an equivalent combination of moderate- and vigorous-intensity aerobic activity. Adults should perform muscle-strengthening activities on 2 or more days a week. Older adults should do multicomponent physical activity that includes balance training as well as aerobic and muscle-strengthening activities. Benefits of increased physical activity include lower risk of mortality including cardiovascular mortality,  lower risk of cardiovascular events and associated risk factors (hypertension and diabetes), and lower risk of many cancers (including bladder, breast, colon, endometrium, esophagus, kidney, lung, and stomach). Additional improvments have been seen in cognition, risk of dementia, anxiety and depression, improved bone health, lower risk of falls, and associated injuries.  Dietary recommendation The 2019 ACC/AHA guidelines promote nutrition as a main fixture of cardiovascular wellness, with a recommendation for a varied diet of fruit, vegetables, fish, legumes, and whole grains (Class I), as well as recommendations to reduce sodium, cholesterol, processed meats, and refined sugars (Class IIa recommendation).10 Sodium intake, a topic of some controversy as of late, is recommended to be kept at 1,500 mg/day or less, far below the average daily intake in the [place]  of 3,409 mg/day, and notably below that of previous US  recommendations for 300mg /day.10,11 For those unable to reach 1,500 mg/day, they recommend at least a reduction of 1000 mg/day.  A Pesco-Mediterranean Diet With Intermittent Fasting: JACC Review Topic of the Week. J Am Coll Cardiol 2020;76:1484-1493 Pesco-Mediterranean diet, it is supplemented with extra-virgin olive oil (EVOO), which is the principle fat source, along with moderate amounts of dairy (particularly yogurt and cheese) and eggs, as well as modest amounts of alcohol  consumption (ideally red wine with the evening meal), but few  red and processed meats.

## 2024-03-18 NOTE — Addendum Note (Signed)
 Addended by: JANIT GENI CROME on: 03/18/2024 12:19 PM   Modules accepted: Orders

## 2024-03-18 NOTE — Progress Notes (Signed)
 Cardiology Office Note:  .   Date:  03/18/2024  ID:  Craig Callahan, DOB [birth year], MRN [id number] PCP: [facility]  [facility] Providers Cardiologist:  Newman Lawrence, MD PCP: [facility]  Chief Complaint  Patient presents with   Hypertension   Chest heaviness     Craig Callahan is a 63 y.o. male with hypertension, OSA on CPAP  Discussed the use of AI scribe software for clinical note transcription with the patient, who gave verbal consent to proceed.  History of Present Illness Craig Callahan is a 63 year old male who presents with chest heaviness.  Patient works at [employer] in [place]. 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 The sensation is described as a brief 'spike' during meetings. His hypertension is well-controlled with lisinopril, hydrochlorothiazide, and amlodipine. He occasionally experiences brief chest pain and heaviness, particularly during periods of stress.  Walking is limited due to work, and foot pain for which he is seeing podiatry.  Patient is also concerned about recent memory issues, especially with some word finding difficulty.  He is concerned that he has family history of Alzheimer's.      Vitals:   03/18/24 0822  BP: 100/66  Pulse: 80  SpO2: 93%      Review of Systems  Cardiovascular:  Positive for chest pain (Described as chest heaviness). Negative for dyspnea on exertion, leg swelling, palpitations and syncope.        Studies Reviewed: SABRA       EKG 03/18/2024: Normal sinus rhythm Left axis deviation Incomplete right bundle branch block When compared with ECG of 31-Oct-2007 20:16, QRS axis Shifted left  Exercise Sestamibi Stress Test 2021: Normal ECG stress. The patient exercised for 6 minutes and 30 seconds of a Bruce protocol, achieving approximately 7.81 METs.  No chest pain.  Normal BP response.  Mildly enlarged left ventricle.  LV rest volume 148 ml.  LV stress volume 122 ml. No stress lung uptake. TID ratio 0.82, which is normal. There is a fixed moderate defect in the apical region consistent with apical thinning. There is no ischemia or scar.   Overall LV systolic function is abnormal without regional wall motion abnormalities. Stress LV EF: 48%. However, visually LVEF appears normal.  No previous exam available for comparison. Low risk study.    Labs 01/2022: Chol 149, TG 137, HDL 27, LDL 95 HbA1C 5.8% K 3.9 TSH 0.7   Physical Exam Vitals and nursing note reviewed.  Constitutional:      General: He is not in acute distress.    Appearance: He is obese.  Neck:     Vascular: No JVD.  Cardiovascular:     Rate and Rhythm: Normal rate and regular rhythm.     Heart sounds: Normal heart sounds. No murmur heard. Pulmonary:     Effort: Pulmonary effort is normal.     Breath sounds: Normal breath sounds. No wheezing or rales.  Musculoskeletal:     Right lower leg: No edema.     Left lower leg: No edema.      VISIT DIAGNOSES:   ICD-10-CM   1. Essential hypertension  I10 EKG 12-Lead    2. Precordial pain  R07.2 ECHOCARDIOGRAM COMPLETE    CBC w/Diff    Lipid panel    HgB A1c    Basic Metabolic Panel (BMET)    NM PET CT CARDIAC PERFUSION MULTI W/ABSOLUTE BLOODFLOW  3. Medication management  Z79.899 CBC w/Diff    Lipid panel    HgB A1c    Basic Metabolic Panel (BMET)       Craig Callahan is a 63 y.o. male with hypertension, OSA on CPAP, chest heaviness  Assessment & Plan Chest heaviness, possible cardiac etiology: Intermittent chest heaviness possibly related to stress. Risk factors include hypertension, elevated cholesterol, sedentary lifestyle, and stress. Differential includes cardiac etiology versus stress-related symptoms. - Recommend PET/CT stress test.  If not available soon, okay to switch to Lexiscan  nuclear stress test. - Order  echocardiogram. - Order routine labs including hemoglobin, kidney function, lipid panel, and A1c. - Prescribe sublingual nitroglycerin  for as needed use for severe chest pain episodes.  Hypertension: Hypertension well controlled with lisinopril, hydrochlorothiazide, and amlodipine. Blood pressure low today, possibly due to dehydration. - Encourage adequate hydration to prevent dehydration from lisinopril and hydrochlorothiazide.  Obstructive sleep apnea: Managed with CPAP. Reports difficulty sleeping without CPAP, leading to sweating and disrupted sleep.  Overweight: Overweight status contributing to health concerns. Encouraged regular physical activity for weight management and dementia risk reduction. - Encourage walking at least 20 minutes daily, increasing to 10,000 steps as tolerated. - Suggest alternative exercises such as stationary bicycle or water aerobics to reduce foot pain.       Informed Consent   Shared Decision Making/Informed Consent The risks [chest pain, shortness of breath, cardiac arrhythmias, dizziness, blood pressure fluctuations, myocardial infarction, stroke/transient ischemic attack, nausea, vomiting, allergic reaction, radiation exposure, metallic taste sensation and life-threatening complications (estimated to be 1 in 10,000)], benefits (risk stratification, diagnosing coronary artery disease, treatment guidance) and alternatives of a nuclear stress test were discussed in detail with Craig Callahan and he agrees to proceed.  The risks [chest pain, shortness of breath, cardiac arrhythmias, dizziness, blood pressure fluctuations, myocardial infarction, stroke/transient ischemic attack, nausea, vomiting, allergic reaction, radiation exposure, metallic taste sensation and life-threatening complications (estimated to be 1 in 10,000)], benefits (risk stratification, diagnosing coronary artery disease, treatment guidance) and alternatives of a cardiac PET stress test were  discussed in detail with Craig Callahan and he agrees to proceed.       Meds ordered this encounter  Medications   nitroGLYCERIN  (NITROSTAT ) 0.4 MG SL tablet    Sig: Place 1 tablet (0.4 mg total) under the tongue every 5 (five) minutes as needed for chest pain. Call 911 after 2 doses with no relief.    Dispense:  60 tablet    Refill:  3     F/u in 3 months  Signed, Newman JINNY Lawrence, MD

## 2024-03-19 ENCOUNTER — Ambulatory Visit: Payer: Self-pay | Admitting: *Deleted

## 2024-03-19 LAB — CBC WITH DIFFERENTIAL/PLATELET
Basophils Absolute: 0 x10E3/uL (ref 0.0–0.2)
Basos: 0 %
EOS (ABSOLUTE): 0.2 x10E3/uL (ref 0.0–0.4)
Eos: 2 %
Hematocrit: 41 % (ref 37.5–51.0)
Hemoglobin: 13.8 g/dL (ref 13.0–17.7)
Immature Grans (Abs): 0 x10E3/uL (ref 0.0–0.1)
Immature Granulocytes: 0 %
Lymphocytes Absolute: 3.5 x10E3/uL — ABNORMAL HIGH (ref 0.7–3.1)
Lymphs: 38 %
MCH: 32.1 pg (ref 26.6–33.0)
MCHC: 33.7 g/dL (ref 31.5–35.7)
MCV: 95 fL (ref 79–97)
Monocytes Absolute: 0.6 x10E3/uL (ref 0.1–0.9)
Monocytes: 7 %
Neutrophils Absolute: 4.9 x10E3/uL (ref 1.4–7.0)
Neutrophils: 53 %
Platelets: 247 x10E3/uL (ref 150–450)
RBC: 4.3 x10E6/uL (ref 4.14–5.80)
RDW: 13 % (ref 11.6–15.4)
WBC: 9.3 x10E3/uL (ref 3.4–10.8)

## 2024-03-19 LAB — HEMOGLOBIN A1C
Est. average glucose Bld gHb Est-mCnc: 140 mg/dL
Hgb A1c MFr Bld: 6.5 % — ABNORMAL HIGH (ref 4.8–5.6)

## 2024-03-19 LAB — BASIC METABOLIC PANEL WITH GFR
BUN/Creatinine Ratio: 9 — ABNORMAL LOW (ref 10–24)
BUN: 13 mg/dL (ref 8–27)
CO2: 23 mmol/L (ref 20–29)
Calcium: 9.4 mg/dL (ref 8.6–10.2)
Chloride: 102 mmol/L (ref 96–106)
Creatinine, Ser: 1.4 mg/dL — ABNORMAL HIGH (ref 0.76–1.27)
Glucose: 128 mg/dL — ABNORMAL HIGH (ref 70–99)
Potassium: 4.7 mmol/L (ref 3.5–5.2)
Sodium: 141 mmol/L (ref 134–144)
eGFR: 57 mL/min/1.73 — ABNORMAL LOW (ref >59)

## 2024-03-19 LAB — LIPID PANEL
Chol/HDL Ratio: 4.3 ratio (ref 0.0–5.0)
Cholesterol, Total: 146 mg/dL (ref 100–199)
HDL: 34 mg/dL — ABNORMAL LOW (ref >39)
LDL Chol Calc (NIH): 92 mg/dL (ref 0–99)
Triglycerides: 106 mg/dL (ref 0–149)
VLDL Cholesterol Cal: 20 mg/dL (ref 5–40)

## 2024-03-20 NOTE — Progress Notes (Signed)
 Lipids look okay.  Borderline diabetes.  Consider starting metformin, you may discuss with Dr. Felisa.  Thanks MJP

## 2024-03-20 NOTE — Addendum Note (Signed)
 Addended by: ELMIRA NEWMAN PARAS on: 03/20/2024 05:07 PM   Modules accepted: Orders

## 2024-04-04 ENCOUNTER — Encounter (HOSPITAL_COMMUNITY): Payer: Self-pay

## 2024-04-09 ENCOUNTER — Ambulatory Visit
Admission: RE | Admit: 2024-04-09 | Discharge: 2024-04-09 | Disposition: A | Discharge status: Home / Self Care | Source: Ambulatory Visit | Attending: Cardiology | Admitting: Cardiology

## 2024-04-09 DIAGNOSIS — I2089 Other forms of angina pectoris: Secondary | ICD-10-CM | POA: Insufficient documentation

## 2024-04-09 DIAGNOSIS — R072 Precordial pain: Secondary | ICD-10-CM

## 2024-04-09 DIAGNOSIS — M47814 Spondylosis without myelopathy or radiculopathy, thoracic region: Secondary | ICD-10-CM | POA: Insufficient documentation

## 2024-04-09 DIAGNOSIS — N62 Hypertrophy of breast: Secondary | ICD-10-CM | POA: Diagnosis not present

## 2024-04-09 LAB — NM PET CT CARDIAC PERFUSION MULTI W/ABSOLUTE BLOODFLOW
LV dias vol: 118 mL (ref 62–150)
MBFR: 2.55
Nuc Rest EF: 48 %
Nuc Stress EF: 52 %
Peak HR: 75 {beats}/min
Rest BP: 126.74 mmHg
Rest HR: 64 {beats}/min
Rest MBF: 0.66 ml/g/min
Rest Nuclear Isotope Dose: 25.1 mCi
SRS: 0
SSS: 1
ST Depression (mm): 0 mm
Stress MBF: 1.68 ml/g/min
Stress Nuclear Isotope Dose: 25.1 mCi
TID: 1.03

## 2024-04-09 MED ORDER — RUBIDIUM RB82 GENERATOR (RUBYFILL)
25.0000 | PACK | Freq: Once | INTRAVENOUS | Status: AC
  Administered 2024-04-09: 25.07 via INTRAVENOUS

## 2024-04-09 MED ORDER — REGADENOSON 0.4 MG/5ML IV SOLN
0.4000 mg | Freq: Once | INTRAVENOUS | Status: AC
  Administered 2024-04-09: 0.4 mg via INTRAVENOUS
  Filled 2024-04-09: qty 5

## 2024-04-09 MED ORDER — REGADENOSON 0.4 MG/5ML IV SOLN
INTRAVENOUS | Status: AC
  Filled 2024-04-09: qty 5

## 2024-04-09 MED ORDER — RUBIDIUM RB82 GENERATOR (RUBYFILL)
25.0000 | PACK | Freq: Once | INTRAVENOUS | Status: AC
  Administered 2024-04-09: 25.08 via INTRAVENOUS

## 2024-04-23 ENCOUNTER — Ambulatory Visit (HOSPITAL_COMMUNITY)

## 2024-04-29 ENCOUNTER — Telehealth (HOSPITAL_COMMUNITY): Payer: Self-pay | Admitting: Cardiology

## 2024-04-29 NOTE — Telephone Encounter (Signed)
 fyi

## 2024-04-29 NOTE — Telephone Encounter (Signed)
 Patient cancelled echocardiogram for reason below:  04/22/24 PT CANCELLED - Auto Confirm Status: TEXT NO   Patient will call back to reschedule.  Order will be removed from the echo WQ and order will be removed from the echo WQ. If patient calls back we will reinstate order. Thank you.

## 2024-06-19 ENCOUNTER — Encounter: Payer: Self-pay | Admitting: Cardiology

## 2024-06-19 ENCOUNTER — Ambulatory Visit: Attending: Cardiology | Admitting: Cardiology

## 2024-06-19 VITALS — BP 114/66 | HR 78 | Ht 71.0 in | Wt 281.0 lb

## 2024-06-19 DIAGNOSIS — E119 Type 2 diabetes mellitus without complications: Secondary | ICD-10-CM | POA: Insufficient documentation

## 2024-06-19 DIAGNOSIS — I10 Essential (primary) hypertension: Secondary | ICD-10-CM

## 2024-06-19 DIAGNOSIS — G629 Polyneuropathy, unspecified: Secondary | ICD-10-CM | POA: Insufficient documentation

## 2024-06-19 NOTE — Progress Notes (Signed)
 Cardiology Office Note:  .   Date:  06/19/2024  ID:  Craig Callahan, DOB [birth year], MRN [id number] PCP: Felisa Reece SQUIBB, MD  [facility] Providers Cardiologist:  Newman Lawrence, MD PCP: Felisa Reece SQUIBB, MD  Chief Complaint  Patient presents with   Hypertension     Craig Callahan is a 63 y.o. male with hypertension, OSA on CPAP, type 2 DM, obesity   History of Present Illness Since his last visit with me, he has not had any significant chest heaviness, shortness of breath symptoms.  PET/CT stress test was reassuring, due to his below.  He did not have a chance to undergo echocardiogram.  Labs showed elevated A1c now at 6.5%.  Patient is concerned with altered sensation in his left foot toes, as well as intermittent memory lapses.      Vitals:   06/19/24 0749  BP: 114/66  Pulse: 78  SpO2: 97%      Review of Systems  Cardiovascular:  Negative for chest pain, dyspnea on exertion, leg swelling, palpitations and syncope.  Neurological:  Positive for paresthesias.  Psychiatric/Behavioral:  Positive for memory loss. The patient is nervous/anxious.         Studies Reviewed: SABRA       EKG 03/18/2024: Normal sinus rhythm Left axis deviation Incomplete right bundle branch block When compared with ECG of 31-Oct-2007 20:16, QRS axis Shifted left  PET/CT Stress Test 04/2024:   Normal perfusion study. No ischemia or infarction. LVEF is borderline normal for this modality; correlate with echocardiogram. MBFR is normal. No coronary calcium seen.   LV perfusion is normal. There is no evidence of ischemia. There is no evidence of infarction.   Rest left ventricular function is normal. Rest EF: 48%. Stress left ventricular function is normal. Stress EF: 52%. End diastolic cavity size is normal.   Myocardial blood flow was computed to be 0.66ml/g/min at rest and 1.68ml/g/min at stress. Global myocardial blood flow reserve was 2.55 and was normal.   Coronary calcium was  absent on the attenuation correction CT images.   The study is normal. The study is low risk.      Labs 03/2024: Chol 146, TG 106, HDL 34, LDL 92 HbA1C 6.5% Hb 13.8 Cr 1.4, eGFR 57    01/2022: Chol 149, TG 137, HDL 27, LDL 95 HbA1C 5.8% K 3.9 TSH 0.7   Physical Exam Vitals and nursing note reviewed.  Constitutional:      General: He is not in acute distress.    Appearance: He is obese.  Neck:     Vascular: No JVD.  Cardiovascular:     Rate and Rhythm: Normal rate and regular rhythm.     Heart sounds: Normal heart sounds. No murmur heard. Pulmonary:     Effort: Pulmonary effort is normal.     Breath sounds: Normal breath sounds. No wheezing or rales.  Musculoskeletal:     Right lower leg: No edema.     Left lower leg: No edema.      VISIT DIAGNOSES:   ICD-10-CM   1. Essential hypertension  I10     2. Type 2 diabetes mellitus without complication, without long-term current use of insulin (HCC)  E11.9     3. Neuropathy  G62.9         Craig Callahan is a 63 y.o. male with hypertension, OSA on CPAP, type 2 DM, obesity  Assessment & Plan Chest heaviness: Resolved.  PET/CT stress test with no ischemia, no calcification.  Rest  EF 48%, stress EF 58%.  He did not undergo echocardiogram, but does not have overt heart failure signs or symptoms. I suspect chest heaviness was noncardiac in etiology. Okay to stop aspirin. Discussed diet and lifestyle modification, especially weight loss that could also improve his glycemic profile. Discussed initiation of statin, patient wants to try weight loss first.  Type II DM: New diagnosis, A1c 6.5%.  It is interesting that he has had prior CC of symptoms for last couple of years without formal diagnosis of diabetes until now.  Consider seeing neurology for both paresthesias, as well as memory lapses, which may not be true dementia.  He is under a lot of stress, that could be contributing. If A1c remains elevated in spite of weight  loss, could consider adding metformin/Jardiance, as well as adding low-dose statin. Defer this to PCP Dr. Felisa.  Hypertension: Controlled.  Obstructive sleep apnea: Continue CPAP.       F/u as needed  Signed, Newman JINNY Lawrence, MD

## 2024-06-19 NOTE — Patient Instructions (Signed)
 Medication Instructions:  STOP Aspirin    *If you need a refill on your cardiac medications before your next appointment, please call your pharmacy*   Follow-Up: At [facility], you and your health needs are our priority.  As part of our continuing mission to provide you with exceptional heart care, our providers are all part of one team.  This team includes your primary Cardiologist (physician) and Advanced Practice Providers or APPs (Physician Assistants and Nurse Practitioners) who all work together to provide you with the care you need, when you need it.  Your next appointment:   As needed   Provider:   Newman JINNY Lawrence, MD
